# Patient Record
Sex: Female | Born: 1990 | Hispanic: Yes | Marital: Married | Smoking: Never smoker
Health system: Southern US, Community
[De-identification: ages and names within clinical notes are randomized; demographics above are authoritative.]

## PROBLEM LIST (undated history)

## (undated) DIAGNOSIS — Z789 Other specified health status: Secondary | ICD-10-CM

## (undated) HISTORY — PX: NO PAST SURGERIES: SHX2092

---

## 2017-10-31 NOTE — L&D Delivery Note (Signed)
Date of delivery: 09/08/2018 Estimated Date of Delivery: 09/16/18 Patient's last menstrual period was 12/18/2017 (approximate). EGA: [redacted]w[redacted]d  Delivery Note At 8:59 PM a viable female was delivered via Vaginal, Spontaneous (Presentation: OA;  LOA).  APGAR: 7, 9;  weight:  8 pounds 2.9 ounces, 3710 grams.   Placenta status: spontaneous, intact.   Cord:  with the following complications: none.  Cord pH: NA  Mom pushed well over 2 contractions to deliver a viable female infant.  The head followed by shoulders, which were snug, and the rest of the body delivered easily.  Nuchal cord reduced on the perineum.  Baby to mom's chest.  Cord clamped and cut after 3 min delay.  Cord blood obtained.  Placenta delivered spontaneously, intact, with a 3-vessel cord. Perineum intact and no other lacerations. All counts correct.  Hemostasis obtained with IV pitocin and fundal massage.     Anesthesia: IV analgesia given 1 hour before delivery  Episiotomy: None Lacerations: None Suture Repair: NA Est. Blood Loss (mL): 600  Mom to postpartum.  Baby to Couplet care / Skin to Skin.  Tresea Mall, CNM 09/08/2018, 9:33 PM

## 2018-03-30 ENCOUNTER — Ambulatory Visit (INDEPENDENT_AMBULATORY_CARE_PROVIDER_SITE_OTHER): Payer: Medicaid Other

## 2018-03-30 ENCOUNTER — Other Ambulatory Visit: Payer: Self-pay | Admitting: Obstetrics & Gynecology

## 2018-03-30 DIAGNOSIS — Z3491 Encounter for supervision of normal pregnancy, unspecified, first trimester: Secondary | ICD-10-CM

## 2018-04-25 ENCOUNTER — Other Ambulatory Visit: Payer: Self-pay | Admitting: Obstetrics & Gynecology

## 2018-04-25 DIAGNOSIS — Z363 Encounter for antenatal screening for malformations: Secondary | ICD-10-CM

## 2018-05-01 ENCOUNTER — Ambulatory Visit (INDEPENDENT_AMBULATORY_CARE_PROVIDER_SITE_OTHER): Payer: Medicaid Other

## 2018-05-01 DIAGNOSIS — Z363 Encounter for antenatal screening for malformations: Secondary | ICD-10-CM

## 2018-05-22 ENCOUNTER — Other Ambulatory Visit: Payer: Self-pay | Admitting: Obstetrics and Gynecology

## 2018-05-22 DIAGNOSIS — Z0489 Encounter for examination and observation for other specified reasons: Secondary | ICD-10-CM

## 2018-05-22 DIAGNOSIS — IMO0002 Reserved for concepts with insufficient information to code with codable children: Secondary | ICD-10-CM

## 2018-07-03 ENCOUNTER — Ambulatory Visit (INDEPENDENT_AMBULATORY_CARE_PROVIDER_SITE_OTHER): Payer: Medicaid Other

## 2018-07-03 DIAGNOSIS — Z0489 Encounter for examination and observation for other specified reasons: Secondary | ICD-10-CM

## 2018-07-03 DIAGNOSIS — Z362 Encounter for other antenatal screening follow-up: Secondary | ICD-10-CM

## 2018-07-03 DIAGNOSIS — IMO0002 Reserved for concepts with insufficient information to code with codable children: Secondary | ICD-10-CM

## 2018-07-09 ENCOUNTER — Encounter: Payer: Self-pay | Admitting: Family Medicine

## 2018-07-09 DIAGNOSIS — O4440 Low lying placenta NOS or without hemorrhage, unspecified trimester: Secondary | ICD-10-CM | POA: Insufficient documentation

## 2018-09-08 ENCOUNTER — Other Ambulatory Visit: Payer: Self-pay

## 2018-09-08 ENCOUNTER — Emergency Department: Admission: EM | Admit: 2018-09-08 | Payer: Self-pay | Source: Home / Self Care | Admitting: Obstetrics and Gynecology

## 2018-09-08 ENCOUNTER — Inpatient Hospital Stay
Admission: AD | Admit: 2018-09-08 | Discharge: 2018-09-10 | DRG: 807 | Disposition: A | Discharge status: Home / Self Care | Payer: Medicaid Other | Attending: Obstetrics and Gynecology | Admitting: Obstetrics and Gynecology

## 2018-09-08 ENCOUNTER — Encounter: Payer: Self-pay | Admitting: *Deleted

## 2018-09-08 DIAGNOSIS — O4202 Full-term premature rupture of membranes, onset of labor within 24 hours of rupture: Secondary | ICD-10-CM | POA: Diagnosis not present

## 2018-09-08 DIAGNOSIS — O4292 Full-term premature rupture of membranes, unspecified as to length of time between rupture and onset of labor: Secondary | ICD-10-CM | POA: Diagnosis present

## 2018-09-08 DIAGNOSIS — Z3A38 38 weeks gestation of pregnancy: Secondary | ICD-10-CM

## 2018-09-08 DIAGNOSIS — O4290 Premature rupture of membranes, unspecified as to length of time between rupture and onset of labor, unspecified weeks of gestation: Secondary | ICD-10-CM

## 2018-09-08 HISTORY — DX: Other specified health status: Z78.9

## 2018-09-08 LAB — CBC
HCT: 37.5 % (ref 36.0–46.0)
Hemoglobin: 12.2 g/dL (ref 12.0–15.0)
MCH: 30 pg (ref 26.0–34.0)
MCHC: 32.5 g/dL (ref 30.0–36.0)
MCV: 92.4 fL (ref 80.0–100.0)
PLATELETS: 189 10*3/uL (ref 150–400)
RBC: 4.06 MIL/uL (ref 3.87–5.11)
RDW: 13.4 % (ref 11.5–15.5)
WBC: 7.3 10*3/uL (ref 4.0–10.5)
nRBC: 0 % (ref 0.0–0.2)

## 2018-09-08 LAB — TYPE AND SCREEN
ABO/RH(D): O POS
Antibody Screen: NEGATIVE

## 2018-09-08 MED ORDER — ONDANSETRON HCL 4 MG/2ML IJ SOLN: 4.0000 mg | INTRAMUSCULAR | Status: DC | PRN

## 2018-09-08 MED ORDER — DIBUCAINE 1 % RE OINT: 1.0000 "application " | TOPICAL_OINTMENT | RECTAL | Status: DC | PRN

## 2018-09-08 MED ORDER — ACETAMINOPHEN 325 MG PO TABS: 650.0000 mg | ORAL_TABLET | ORAL | Status: DC | PRN

## 2018-09-08 MED ORDER — OXYTOCIN 10 UNIT/ML IJ SOLN
INTRAMUSCULAR | Status: AC
  Filled 2018-09-08: qty 2

## 2018-09-08 MED ORDER — LACTATED RINGERS IV SOLN: 500.0000 mL | INTRAVENOUS | Status: DC | PRN

## 2018-09-08 MED ORDER — ONDANSETRON HCL 4 MG PO TABS: 4.0000 mg | ORAL_TABLET | ORAL | Status: DC | PRN

## 2018-09-08 MED ORDER — LIDOCAINE HCL (PF) 1 % IJ SOLN: 30.0000 mL | INTRAMUSCULAR | Status: DC | PRN

## 2018-09-08 MED ORDER — SIMETHICONE 80 MG PO CHEW: 80.0000 mg | CHEWABLE_TABLET | ORAL | Status: DC | PRN

## 2018-09-08 MED ORDER — LIDOCAINE HCL (PF) 1 % IJ SOLN
INTRAMUSCULAR | Status: AC
  Filled 2018-09-08: qty 30

## 2018-09-08 MED ORDER — SENNOSIDES-DOCUSATE SODIUM 8.6-50 MG PO TABS
2.0000 | ORAL_TABLET | ORAL | Status: DC
  Administered 2018-09-09 – 2018-09-10 (×2): 2 via ORAL
  Filled 2018-09-08 (×2): qty 2

## 2018-09-08 MED ORDER — TERBUTALINE SULFATE 1 MG/ML IJ SOLN: 0.2500 mg | Freq: Once | INTRAMUSCULAR | Status: DC | PRN

## 2018-09-08 MED ORDER — MISOPROSTOL 200 MCG PO TABS
ORAL_TABLET | ORAL | Status: AC
  Filled 2018-09-08: qty 4

## 2018-09-08 MED ORDER — COCONUT OIL OIL: 1.0000 "application " | TOPICAL_OIL | Status: DC | PRN

## 2018-09-08 MED ORDER — AMMONIA AROMATIC IN INHA
RESPIRATORY_TRACT | Status: AC
  Filled 2018-09-08: qty 10

## 2018-09-08 MED ORDER — ONDANSETRON HCL 4 MG/2ML IJ SOLN: 4.0000 mg | Freq: Four times a day (QID) | INTRAMUSCULAR | Status: DC | PRN

## 2018-09-08 MED ORDER — BENZOCAINE-MENTHOL 20-0.5 % EX AERO: 1.0000 "application " | INHALATION_SPRAY | CUTANEOUS | Status: DC | PRN

## 2018-09-08 MED ORDER — OXYTOCIN BOLUS FROM INFUSION: 500.0000 mL | Freq: Once | INTRAVENOUS | Status: DC

## 2018-09-08 MED ORDER — DIPHENHYDRAMINE HCL 25 MG PO CAPS: 25.0000 mg | ORAL_CAPSULE | Freq: Four times a day (QID) | ORAL | Status: DC | PRN

## 2018-09-08 MED ORDER — BUTORPHANOL TARTRATE 1 MG/ML IJ SOLN
1.0000 mg | INTRAMUSCULAR | Status: DC | PRN
  Administered 2018-09-08: 1 mg via INTRAVENOUS
  Filled 2018-09-08: qty 1

## 2018-09-08 MED ORDER — WITCH HAZEL-GLYCERIN EX PADS: 1.0000 "application " | MEDICATED_PAD | CUTANEOUS | Status: DC | PRN

## 2018-09-08 MED ORDER — OXYTOCIN 40 UNITS IN LACTATED RINGERS INFUSION - SIMPLE MED
1.0000 m[IU]/min | INTRAVENOUS | Status: DC
  Administered 2018-09-08: 2 m[IU]/min via INTRAVENOUS
  Filled 2018-09-08: qty 1000

## 2018-09-08 MED ORDER — OXYTOCIN 40 UNITS IN LACTATED RINGERS INFUSION - SIMPLE MED: 2.5000 [IU]/h | INTRAVENOUS | Status: DC

## 2018-09-08 MED ORDER — OXYTOCIN 40 UNITS IN LACTATED RINGERS INFUSION - SIMPLE MED
INTRAVENOUS | Status: AC
  Filled 2018-09-08: qty 1000

## 2018-09-08 MED ORDER — IBUPROFEN 600 MG PO TABS
600.0000 mg | ORAL_TABLET | Freq: Four times a day (QID) | ORAL | Status: DC
  Administered 2018-09-09 – 2018-09-10 (×4): 600 mg via ORAL
  Filled 2018-09-08 (×6): qty 1

## 2018-09-08 MED ORDER — LACTATED RINGERS IV SOLN
INTRAVENOUS | Status: DC
  Administered 2018-09-08: 13:00:00 via INTRAVENOUS

## 2018-09-08 MED ORDER — PRENATAL MULTIVITAMIN CH
1.0000 | ORAL_TABLET | Freq: Every day | ORAL | Status: DC
  Administered 2018-09-09 – 2018-09-10 (×2): 1 via ORAL
  Filled 2018-09-08 (×2): qty 1

## 2018-09-08 NOTE — H&P (Signed)
OB History & Physical   History of Present Illness:  Chief Complaint: water broke  HPI:  Bridget Cline is a 27 y.o. G2P1001 female at [redacted]w[redacted]d dated by 15 week u/s.  Her pregnancy has been complicated by UTI/treated, marginal placenta previa- resolved per September u/s, varicella non-immune.    She denies contractions.   She reports leakage of fluid at 8 AM this morning.   She denies vaginal bleeding.   She reports fetal movement.    Maternal Medical History:   Past Medical History:  Diagnosis Date  . Eating disorders, special diets, PICA     Past Surgical History:  Procedure Laterality Date  . NO PAST SURGERIES       Prior to Admission medications   Medication Sig Start Date End Date Taking? Authorizing Provider  Prenatal Vit-Fe Fumarate-FA (MULTIVITAMIN-PRENATAL) 27-0.8 MG TABS tablet Take 1 tablet by mouth daily at 12 noon.   Yes [provider]    OB History  Gravida Para Term Preterm AB Living  2 1 1  0 0 1  SAB TAB Ectopic Multiple Live Births               # Outcome Date GA Lbr Len/2nd Weight Sex Delivery Anes PTL Lv  2 Current           1 Term             Prenatal care site: [facility]  Social History: She  reports that she has never smoked. She has never used smokeless tobacco. She reports that she does not drink alcohol or use drugs.  Family History: gestational diabetes  She does not have a family history of gynecologic cancers  Review of Systems: Negative x 10 systems reviewed except as noted in the HPI.    Physical Exam:  Vital Signs: Ht 5' (1.524 m)   Wt 80.7 kg   BMI 34.76 kg/m  Constitutional: Well nourished, well developed female in no acute distress.  HEENT: normal Skin: Warm and dry.  Cardiovascular: Regular rate and rhythm.   Extremity: 2+ reflexes, no edema  Respiratory: Clear to auscultation bilateral. Normal respiratory effort Abdomen: FHT present Back: no CVAT Neuro: DTRs 2+, Cranial nerves grossly  intact Psych: Alert and Oriented x3. No memory deficits. Normal mood and affect.  MS: normal gait, normal bilateral lower extremity ROM/strength/stability.  Pelvic exam:  is not limited by body habitus EGBUS: within normal limits Vagina: within normal limits and with normal mucosa  Cervix: 3 cm/50/-2 Sterile speculum exam is positive for Pooling clear/pink tinged- grossly ruptured, fetal hair seen, +nitrazine   Pertinent Results:  Prenatal Labs: Blood type/Rh O positive  Antibody screen negative  Rubella Immune  Varicella Not immune    RPR Non-reactive  HBsAg negative  HIV negative  GC negative  Chlamydia negative  Genetic screening declined  1 hour GTT 81  3 hour GTT NA  GBS negative on 10/23   Baseline FHR: 135 beats/min   Variability: moderate   Accelerations: present   Decelerations: absent Contractions: present frequency: 2-10 Overall assessment: reassuring   Assessment:  Bridget Cline is a 27 y.o. G51P1001 female at [redacted]w[redacted]d with SROM clear, irregular contractions not felt by patient.   Plan:  1. Admit to Labor & Delivery  2. CBC, T&S, Clrs, IVF 3. GBS negative.   4. Fetal well-being: Category I 5. Ambulate and re-check for cervical change, Pitocin as needed   Tresea Mall, CNM 09/08/2018 11:28 AM

## 2018-09-08 NOTE — Discharge Summary (Signed)
OB Discharge Summary     Patient Name: Bridget Cline DOB: [birth year] MRN: [id number]  Date of admission: 09/08/2018 Delivering MD: Tresea Mall, CNM  Date of Delivery: 09/08/2018  Date of discharge: 09/10/2018  Admitting diagnosis: Leaking fluid, spontaneous rupture of membranes Intrauterine pregnancy: [redacted]w[redacted]d     Secondary diagnosis: None     Discharge diagnosis: Term Pregnancy Delivered                                                                                                Post partum procedures: none  Augmentation: Pitocin  Complications: None  Hospital course:  Induction of Labor With Vaginal Delivery   27 y.o. yo G2P1001 at [redacted]w[redacted]d was admitted to the hospital 09/08/2018 for induction of labor.   Indication for induction: PROM.  Patient had an uncomplicated labor course as follows: Membrane Rupture Time/Date: 8:00 AM ,09/08/2018   Patient had delivery of viable female 8:59 PM, 09/08/2018  Details of delivery can be found in separate delivery note.   Patient had a routine postpartum course.  Patient is discharged home on 09/10/2018.  Physical exam  Vitals:   09/09/18 1648 09/09/18 1923 09/10/18 0015 09/10/18 0737  BP: 97/65 112/71 103/62 113/85  Pulse: 84 99 77 77  Resp: 18 20 20 20   Temp: 98.7 F (37.1 C) 98.1 F (36.7 C) 97.7 F (36.5 C) 98.2 F (36.8 C)  TempSrc: Oral Oral Oral Oral  SpO2: 97% 98% 99% 98%  Weight:      Height:       General: alert, cooperative and no distress Lochia: appropriate Uterine Fundus: firm Incision: N/A DVT Evaluation: No evidence of DVT seen on physical exam.  Labs: Lab Results  Component Value Date   WBC 17.9 (H) 09/09/2018   HGB 10.6 (L) 09/09/2018   HCT 31.9 (L) 09/09/2018   MCV 91.1 09/09/2018   PLT 175 09/09/2018    Discharge instruction: per After Visit Summary.  Medications:  Allergies as of 09/10/2018   Not on File     Medication List    TAKE these medications   multivitamin-prenatal 27-0.8 MG  Tabs tablet Take 1 tablet by mouth daily at 12 noon.       Diet: routine diet  Activity: Advance as tolerated. Pelvic rest for 6 weeks.   Outpatient follow up: Follow-up Information    Department, [facility]. Schedule an appointment as soon as possible for a visit in 6 week(s).   Why:  postpartum follow up Contact information: [address] [place] [address] [phone]             Postpartum contraception: Natural Family Planning Rhogam Given postpartum: NA Rubella vaccine given postpartum: Rubella Immune Varicella vaccine given postpartum: ordered TDaP given antepartum or postpartum: given antepartum  Newborn Data: Live born female Margaretmary Lombard Birth Weight: 3710 grams, 8 pounds 2.9 ounces  APGAR: 7, 9  Newborn Delivery   Birth date/time:  09/08/2018 20:59:00 Delivery type:  Vaginal, Spontaneous      Baby Feeding: Breast and Formula  Disposition: Newborn receiving phototherapy, may be able to discharge  with mother later today. Otherwise will plan to board mother overnight.  SIGNED:  Oswaldo Conroy, CNM 09/10/2018 10:41 AM

## 2018-09-08 NOTE — Progress Notes (Signed)
  Labor Progress Note   27 y.o. G2P1001 @ [redacted]w[redacted]d , admitted for  Pregnancy, Labor Management. Augmentation for SROM  Subjective:  Patient is requesting pain medication. Interpreter on a stick in the room. Discussion of the options.   Objective:  BP 113/61   Pulse 83   Temp 98.2 F (36.8 C) (Oral)   Resp 16   Ht 5' (1.524 m)   Wt 80.7 kg   LMP 12/18/2017 (Approximate)   SpO2 95%   BMI 34.76 kg/m  Abd: mild Extr: no edema SVE: CERVIX: 5 cm dilated, 90 effaced, 0 station  EFM: FHR: 140 bpm, variability: moderate,  accelerations:  Present,  decelerations:  Present early decelerations noted Toco: Frequency: Every 2-3 minutes Labs: I have reviewed the patient's lab results.   Assessment & Plan:  G2P1001 @ [redacted]w[redacted]d, admitted for  Pregnancy and Labor/Delivery Management  1. Pain management: 1 mg stadol. 2. FWB: FHT category II for early decelerations and overall reassuring.  3. ID: GBS negative 4. Labor management: continue pitocin  All discussed with patient, see orders  Tresea Mall, CNM [facility], [facility] 09/08/2018  7:58 PM

## 2018-09-08 NOTE — OB Triage Note (Signed)
SROM @ 0800 this am. Reports good fetal movement. Elaina Hoops

## 2018-09-08 NOTE — Progress Notes (Signed)
  Labor Progress Note   27 y.o. G2P1001 @ [redacted]w[redacted]d , admitted for  Pregnancy, Labor Management. SROM this morning  Subjective:  Patient is still comfortable and not feeling contractions. Translator is in the room. Discussion of augmentation with pitocin. All questions answered.   Objective:  BP 106/62 (BP Location: Left Arm)   Pulse 84   Temp 98.5 F (36.9 C) (Oral)   Resp 18   Ht 5' (1.524 m)   Wt 80.7 kg   LMP 12/18/2017 (Approximate)   BMI 34.76 kg/m  Abd: mild Extr: no edema SVE: deferred  EFM: FHR: 130 bpm, variability: moderate,  accelerations:  Present,  decelerations:  Absent Toco: Frequency: Every 1-8 minutes Labs: I have reviewed the patient's lab results.   Assessment & Plan:  G2P1001 @ [redacted]w[redacted]d, admitted for  Pregnancy and Labor/Delivery Management  1. Pain management: none. 2. FWB: FHT category I.  3. ID: GBS negative 4. Labor management: start pitocin  All discussed with patient, see orders   Tresea Mall, CNM [facility], [facility] 09/08/2018  2:07 PM

## 2018-09-08 NOTE — Progress Notes (Signed)
  Labor Progress Note   27 y.o. G2P1001 @ [redacted]w[redacted]d , admitted for  Pregnancy, Labor Management. Augmentation for SROM  Subjective:  She has been feeling the contractions and is coping well through them. She has been walking.  Objective:  BP 113/61   Pulse 83   Temp 98.2 F (36.8 C) (Oral)   Resp 16   Ht 5' (1.524 m)   Wt 80.7 kg   LMP 12/18/2017 (Approximate)   SpO2 95%   BMI 34.76 kg/m  Abd: mild Extr: no edema SVE: CERVIX: 4 cm dilated, 80 effaced, -1 station  EFM: FHR: 135 bpm, variability: moderate,  accelerations:  Present,  decelerations:  Absent Toco: Frequency: Every 2-3 minutes Labs: I have reviewed the patient's lab results.   Assessment & Plan:  G2P1001 @ [redacted]w[redacted]d, admitted for  Pregnancy and Labor/Delivery Management  1. Pain management: none. 2. FWB: FHT category I.  3. ID: GBS negative 4. Labor management: Continue pitocin  All discussed with patient, see orders   Tresea Mall, CNM [facility], [facility] 09/08/2018  6:59 PM

## 2018-09-09 DIAGNOSIS — Z3A38 38 weeks gestation of pregnancy: Secondary | ICD-10-CM

## 2018-09-09 DIAGNOSIS — O4202 Full-term premature rupture of membranes, onset of labor within 24 hours of rupture: Secondary | ICD-10-CM

## 2018-09-09 LAB — CBC
HCT: 31.9 % — ABNORMAL LOW (ref 36.0–46.0)
Hemoglobin: 10.6 g/dL — ABNORMAL LOW (ref 12.0–15.0)
MCH: 30.3 pg (ref 26.0–34.0)
MCHC: 33.2 g/dL (ref 30.0–36.0)
MCV: 91.1 fL (ref 80.0–100.0)
NRBC: 0 % (ref 0.0–0.2)
PLATELETS: 175 10*3/uL (ref 150–400)
RBC: 3.5 MIL/uL — ABNORMAL LOW (ref 3.87–5.11)
RDW: 13.5 % (ref 11.5–15.5)
WBC: 17.9 10*3/uL — AB (ref 4.0–10.5)

## 2018-09-09 NOTE — Progress Notes (Signed)
Post Partum Day 1 Subjective: Doing well, no complaints.  Tolerating regular diet, pain with PO meds, voiding and ambulating without difficulty. Baby has latched at breast and she is pumping, but is concerned that she has no milk yet, so she is also supplementing with formula. Encouraged continued stimulation of breast.   No CP SOB F/C N/V or leg pain No HA, change of vision, RUQ/epigastric pain  Objective: BP (!) 97/53 (BP Location: Right Arm)   Pulse 92   Temp 98.7 F (37.1 C) (Oral)   Resp 18   Ht 5' (1.524 m)   Wt 80.7 kg   LMP 12/18/2017 (Approximate)   SpO2 99%   BMI 34.76 kg/m    Physical Exam:  General: NAD CV: RRR Pulm: nl effort, CTABL Lochia: moderate Uterine Fundus: fundus firm and below umbilicus DVT Evaluation: no cords, ttp LEs   Recent Labs    09/08/18 1327 09/09/18 0520  HGB 12.2 10.6*  HCT 37.5 31.9*  WBC 7.3 17.9*  PLT 189 175    Assessment/Plan: 27 y.o. G2P1001 postpartum day # 1  1. Continue routine postpartum care  2. O positive, Rubella Immune, Varicella Non-immune 3. TDAP given antepartum 4. Breast and formula feeding/Contraception: natural family planning    Tresea Mall, CNM [facility], [facility]

## 2018-09-09 NOTE — Lactation Note (Signed)
This note was copied from a baby's chart. Lactation Consultation Note  Patient Name: Girl Alicianna Litchford Today's Date: 09/09/2018     Maternal Data    Feeding    LATCH Score                   Interventions    Lactation Tools Discussed/Used     Consult Status  Parents state that they think breastfeeding is going well, but are unsure if baby is getting enough at the breast so have been following up with formula. LC used translator to explain physiology of breastfeeding and spoke with parents about frequent removal of milk creating more milk. LC also explained newborn stomach size and transitional stool as well as breastfeeding resources at discharge.     Burnadette Peter 09/09/2018, 4:15 PM

## 2018-09-10 DIAGNOSIS — O4202 Full-term premature rupture of membranes, onset of labor within 24 hours of rupture: Secondary | ICD-10-CM

## 2018-09-10 DIAGNOSIS — Z3A38 38 weeks gestation of pregnancy: Secondary | ICD-10-CM

## 2018-09-10 LAB — RPR: RPR: NONREACTIVE

## 2018-09-10 MED ORDER — VARICELLA VIRUS VACCINE LIVE 1350 PFU/0.5ML IJ SUSR
0.5000 mL | Freq: Once | INTRAMUSCULAR | Status: AC
  Administered 2018-09-10: 0.5 mL via SUBCUTANEOUS
  Filled 2018-09-10 (×2): qty 0.5

## 2018-09-10 NOTE — Progress Notes (Signed)
pt discharged home with infant.  Discharge instructions, prescriptions and follow up appointment given to and reviewed with pt.  Pt verbalized understanding, all questions answered.  Escorted by auxiliary. 

## 2018-09-10 NOTE — Lactation Note (Signed)
This note was copied from a baby's chart. Lactation Consultation Note  Patient Name: Bridget Cline GNFAO'Z Date: 09/10/2018 Reason for consult: Follow-up assessment;Hyperbilirubinemia(Jacque spanish interpreter present)   Maternal Data Formula Feeding for Exclusion: No Does the patient have breastfeeding experience prior to this delivery?: Yes  Feeding Feeding Type: (did not observe a feeding) Nipple Type: Slow - flow  LATCH Score Latch: (baby sleeping under bili lites, no feeding observed)                 Interventions Interventions: Breast feeding basics reviewed(encouraged feeding at breast first then supplementing after)  Lactation Tools Discussed/Used WIC Program: Yes Pump Review: Setup, frequency, and cleaning(use of manual pump) Initiated by:: Cay Schillings [facility] IBCLC   Consult Status Consult Status: PRN    Dyann Kief 09/10/2018, 4:04 PM

## 2018-09-29 ENCOUNTER — Emergency Department: Payer: Medicaid Other

## 2018-09-29 ENCOUNTER — Other Ambulatory Visit: Payer: Self-pay

## 2018-09-29 ENCOUNTER — Encounter: Payer: Self-pay | Admitting: Emergency Medicine

## 2018-09-29 ENCOUNTER — Inpatient Hospital Stay
Admission: EM | Admit: 2018-09-29 | Discharge: 2018-10-01 | DRG: 776 | Disposition: A | Discharge status: Home / Self Care | Payer: Medicaid Other | Attending: Internal Medicine | Admitting: Internal Medicine

## 2018-09-29 DIAGNOSIS — O9122 Nonpurulent mastitis associated with the puerperium: Principal | ICD-10-CM | POA: Diagnosis present

## 2018-09-29 DIAGNOSIS — A419 Sepsis, unspecified organism: Secondary | ICD-10-CM | POA: Diagnosis present

## 2018-09-29 DIAGNOSIS — N61 Mastitis without abscess: Secondary | ICD-10-CM

## 2018-09-29 DIAGNOSIS — O9229 Other disorders of breast associated with pregnancy and the puerperium: Secondary | ICD-10-CM | POA: Diagnosis present

## 2018-09-29 LAB — CG4 I-STAT (LACTIC ACID)
Lactic Acid, Venous: 2.04 mmol/L (ref 0.5–1.9)
Lactic Acid, Venous: 1.22 mmol/L (ref 0.5–1.9)

## 2018-09-29 LAB — CBC WITH DIFFERENTIAL/PLATELET
Abs Immature Granulocytes: 0.25 10*3/uL — ABNORMAL HIGH (ref 0.00–0.07)
BASOS ABS: 0.1 10*3/uL (ref 0.0–0.1)
Basophils Relative: 0 %
Eosinophils Absolute: 0.2 10*3/uL (ref 0.0–0.5)
Eosinophils Relative: 1 %
HCT: 40.2 % (ref 36.0–46.0)
Hemoglobin: 13.9 g/dL (ref 12.0–15.0)
Immature Granulocytes: 2 %
Lymphocytes Relative: 9 %
Lymphs Abs: 1.4 10*3/uL (ref 0.7–4.0)
MCH: 29.8 pg (ref 26.0–34.0)
MCHC: 34.6 g/dL (ref 30.0–36.0)
MCV: 86.1 fL (ref 80.0–100.0)
Monocytes Absolute: 0.6 10*3/uL (ref 0.1–1.0)
Monocytes Relative: 4 %
NEUTROS ABS: 13.2 10*3/uL — AB (ref 1.7–7.7)
NRBC: 0 % (ref 0.0–0.2)
Neutrophils Relative %: 84 %
Platelets: 316 10*3/uL (ref 150–400)
RBC: 4.67 MIL/uL (ref 3.87–5.11)
RDW: 13.1 % (ref 11.5–15.5)
WBC: 15.6 10*3/uL — AB (ref 4.0–10.5)

## 2018-09-29 LAB — COMPREHENSIVE METABOLIC PANEL
ALT: 23 U/L (ref 0–44)
AST: 26 U/L (ref 15–41)
Albumin: 3.5 g/dL (ref 3.5–5.0)
Alkaline Phosphatase: 68 U/L (ref 38–126)
Anion gap: 13 (ref 5–15)
BUN: 12 mg/dL (ref 6–20)
CHLORIDE: 96 mmol/L — AB (ref 98–111)
CO2: 24 mmol/L (ref 22–32)
Calcium: 8.8 mg/dL — ABNORMAL LOW (ref 8.9–10.3)
Creatinine, Ser: 0.74 mg/dL (ref 0.44–1.00)
GFR calc Af Amer: >60 mL/min (ref >60)
Glucose, Bld: 140 mg/dL — ABNORMAL HIGH (ref 70–99)
Potassium: 3.6 mmol/L (ref 3.5–5.1)
SODIUM: 133 mmol/L — AB (ref 135–145)
Total Bilirubin: 1 mg/dL (ref 0.3–1.2)
Total Protein: 7.9 g/dL (ref 6.5–8.1)

## 2018-09-29 MED ORDER — PRENATAL MULTIVITAMIN CH
1.0000 | ORAL_TABLET | Freq: Every day | ORAL | Status: DC
  Administered 2018-10-01: 1 via ORAL
  Filled 2018-09-29 (×2): qty 1

## 2018-09-29 MED ORDER — ONDANSETRON HCL 4 MG PO TABS: 4.0000 mg | ORAL_TABLET | Freq: Four times a day (QID) | ORAL | Status: DC | PRN

## 2018-09-29 MED ORDER — PIPERACILLIN-TAZOBACTAM 3.375 G IVPB
3.3750 g | Freq: Three times a day (TID) | INTRAVENOUS | Status: DC
  Administered 2018-09-29 – 2018-10-01 (×5): 3.375 g via INTRAVENOUS
  Filled 2018-09-29 (×7): qty 50

## 2018-09-29 MED ORDER — ACETAMINOPHEN 650 MG RE SUPP: 650.0000 mg | Freq: Four times a day (QID) | RECTAL | Status: DC | PRN

## 2018-09-29 MED ORDER — ENOXAPARIN SODIUM 40 MG/0.4ML ~~LOC~~ SOLN
40.0000 mg | SUBCUTANEOUS | Status: DC
  Administered 2018-09-29 – 2018-10-01 (×2): 40 mg via SUBCUTANEOUS
  Filled 2018-09-29 (×2): qty 0.4

## 2018-09-29 MED ORDER — SODIUM CHLORIDE 0.9 % IV BOLUS
1000.0000 mL | Freq: Once | INTRAVENOUS | Status: AC
  Administered 2018-09-29: 1000 mL via INTRAVENOUS

## 2018-09-29 MED ORDER — ACETAMINOPHEN 325 MG PO TABS
650.0000 mg | ORAL_TABLET | Freq: Four times a day (QID) | ORAL | Status: DC | PRN
  Administered 2018-09-29 – 2018-10-01 (×4): 650 mg via ORAL
  Filled 2018-09-29 (×4): qty 2

## 2018-09-29 MED ORDER — ACETAMINOPHEN 500 MG PO TABS
1000.0000 mg | ORAL_TABLET | Freq: Once | ORAL | Status: AC
  Administered 2018-09-29: 1000 mg via ORAL
  Filled 2018-09-29: qty 2

## 2018-09-29 MED ORDER — ONDANSETRON HCL 4 MG/2ML IJ SOLN: 4.0000 mg | Freq: Four times a day (QID) | INTRAMUSCULAR | Status: DC | PRN

## 2018-09-29 MED ORDER — VANCOMYCIN HCL IN DEXTROSE 1-5 GM/200ML-% IV SOLN
1000.0000 mg | Freq: Once | INTRAVENOUS | Status: AC
  Administered 2018-09-29: 1000 mg via INTRAVENOUS
  Filled 2018-09-29: qty 200

## 2018-09-29 MED ORDER — OXYCODONE HCL 5 MG PO TABS: 5.0000 mg | ORAL_TABLET | Freq: Four times a day (QID) | ORAL | Status: DC | PRN

## 2018-09-29 MED ORDER — SODIUM CHLORIDE 0.9 % IV SOLN
INTRAVENOUS | Status: DC
  Administered 2018-09-29 – 2018-10-01 (×4): via INTRAVENOUS

## 2018-09-29 MED ORDER — VANCOMYCIN HCL IN DEXTROSE 750-5 MG/150ML-% IV SOLN
750.0000 mg | Freq: Two times a day (BID) | INTRAVENOUS | Status: DC
  Administered 2018-09-30 – 2018-10-01 (×3): 750 mg via INTRAVENOUS
  Filled 2018-09-29 (×6): qty 150

## 2018-09-29 MED ORDER — POLYETHYLENE GLYCOL 3350 17 G PO PACK
17.0000 g | PACK | Freq: Every day | ORAL | Status: DC | PRN
  Filled 2018-09-29: qty 1

## 2018-09-29 NOTE — ED Notes (Signed)
Patient transported to Ultrasound 

## 2018-09-29 NOTE — H&P (Addendum)
[facility] [place] [facility]   PATIENT NAME: Bridget Cline    MR#:  [id number]  DATE OF BIRTH:  12-Jan-1991  DATE OF ADMISSION:  09/29/2018  PRIMARY CARE PHYSICIAN: Patient, No Pcp Per   REQUESTING/REFERRING PHYSICIAN: Minna Antis, MD  CHIEF COMPLAINT:   Chief Complaint  Patient presents with  . Breast Pain    HISTORY OF PRESENT ILLNESS:  Bridget Cline  is a 27 y.o. female with a known history of recent vaginal delivery on 09/08/2018 who presented to the ED with left breast pain that started this morning.  She also endorses redness and warmth of the left breast, in addition to fevers and chills.  She had been recently diagnosed with mastitis and was prescribed dicloxacillin.  She took 4 days of the antibiotic and then stopped because she felt that her baby seemed more fussy.  She stopped taking the antibiotic on Monday.  Her pain, redness, and warmth returned this morning.  She is both breast and bottlefeeding.  She is breast-feeding every 3 hours.  She has not noticed any bloody or purulent nipple discharge.  Her breast pain is worsened with breast-feeding.  In the ED, she was meeting sepsis criteria with a temperature of 102.9 and initial heart rate of 150.  Labs are significant for WBC 15.6 and lactic acid 2.04.  He was given 2 L normal saline bolus and was started on vancomycin.  Hospitalists were called for admission.  PAST MEDICAL HISTORY:   Past Medical History:  Diagnosis Date  . Medical history non-contributory     PAST SURGICAL HISTORY:   Past Surgical History:  Procedure Laterality Date  . NO PAST SURGERIES      SOCIAL HISTORY:   Social History   Tobacco Use  . Smoking status: Never Smoker  . Smokeless tobacco: Never Used  Substance Use Topics  . Alcohol use: Never    Frequency: Never    FAMILY HISTORY:  No family history on file.  DRUG ALLERGIES:  Not on File  REVIEW OF SYSTEMS:   Review of Systems    Constitutional: Positive for chills and fever.  HENT: Negative for congestion and sore throat.   Eyes: Negative for blurred vision and double vision.  Respiratory: Negative for cough and shortness of breath.   Cardiovascular: Negative for chest pain and leg swelling.  Gastrointestinal: Negative for abdominal pain, nausea and vomiting.  Genitourinary: Negative for dysuria, frequency and urgency.  Musculoskeletal: Negative for back pain and myalgias.  Neurological: Negative for dizziness and headaches.  Psychiatric/Behavioral: Negative for depression. The patient is not nervous/anxious.     MEDICATIONS AT HOME:   Prior to Admission medications   Medication Sig Start Date End Date Taking? Authorizing Provider  dicloxacillin (DYNAPEN) 500 MG capsule Take 500 mg by mouth 4 (four) times daily. 09/24/18 10/04/18 Yes [provider]  Prenatal Vit-Fe Fumarate-FA (PRENATAL VITAMIN PLUS LOW IRON) 27-1 MG TABS Take 1 tablet by mouth daily. 09/06/18  Yes [provider]      VITAL SIGNS:  Blood pressure (!) 92/51, pulse 95, temperature 100.1 F (37.8 C), temperature source Oral, resp. rate (!) 22, height 5' (1.524 m), weight 64.9 kg, SpO2 96 %, unknown if currently breastfeeding.  PHYSICAL EXAMINATION:  Physical Exam  GENERAL:  27 y.o.-year-old patient lying in the bed with no acute distress.  EYES: Pupils equal, round, reactive to light and accommodation. No scleral icterus. Extraocular muscles intact.  HEENT: Head atraumatic, normocephalic. Oropharynx and  nasopharynx clear.  NECK:  Supple, no jugular venous distention. No thyroid enlargement, no tenderness.  BREAST: left breast with erythema, warmth, and significant tenderness to palpation. No nipple discharge. LUNGS: Normal breath sounds bilaterally, no wheezing, rales,rhonchi or crepitation. No use of accessory muscles of respiration.  CARDIOVASCULAR: Tachycardic, regular rhythm, S1, S2 normal. No murmurs, rubs, or gallops.   ABDOMEN: Soft, nontender, nondistended. Bowel sounds present. No organomegaly or mass.  EXTREMITIES: No pedal edema, cyanosis, or clubbing.  NEUROLOGIC: Cranial nerves II through XII are intact. Muscle strength 5/5 in all extremities. Sensation intact. Gait not checked.  PSYCHIATRIC: The patient is alert and oriented x 3.  SKIN: No obvious rash, lesion, or ulcer.   LABORATORY PANEL:   CBC Recent Labs  Lab 09/29/18 1408  WBC 15.6*  HGB 13.9  HCT 40.2  PLT 316   ------------------------------------------------------------------------------------------------------------------  Chemistries  Recent Labs  Lab 09/29/18 1408  NA 133*  K 3.6  CL 96*  CO2 24  GLUCOSE 140*  BUN 12  CREATININE 0.74  CALCIUM 8.8*  AST 26  ALT 23  ALKPHOS 68  BILITOT 1.0   ------------------------------------------------------------------------------------------------------------------  Cardiac Enzymes No results for input(s): TROPONINI in the last 168 hours. ------------------------------------------------------------------------------------------------------------------  RADIOLOGY:  [place] Breast Ltd Uni Left Inc Axilla  Result Date: 09/29/2018 CLINICAL DATA:  Pain and redness of the left breast for 24 hours. Breastfeeding patient. EXAM: ULTRASOUND OF THE LEFT BREAST COMPARISON:  None. FINDINGS: Static images from targeted left breast ultrasound demonstrate breast edema with trabecular and skin thickening. No drainable fluid collections are seen. IMPRESSION: Left breast edema without drainable fluid collections. Findings are most consistent with acute mastitis. RECOMMENDATION: Clinical follow-up. I have discussed the findings and recommendations with the patient. Results were also provided in writing at the conclusion of the visit. If applicable, a reminder letter will be sent to the patient regarding the next appointment. BI-RADS CATEGORY  2: Benign. Electronically Signed   By: Ted Mcalpineobrinka  Dimitrova  M.D.   On: 09/29/2018 15:56      IMPRESSION AND PLAN:   Sepsis secondary to left-sided mastitis- previously on dicloxacillin for 4 days and she had improvement with this before stopping it, so did not fail outpatient therapy.  -s/p 2L NS bolus, will continue IVFs -Continue vancomycin and zosyn for now per pharmacy recommendations. Can likely narrow tomorrow. -Blood and urine cultures pending -Oxycodone and tylenol for pain  Lactic acidosis- likely due to above -IVFs -Trend lactic acid  Hypovolemic hyponatremia- Na 133 -IVFs -Repeat bmp in the morning  Recent vaginal delivery 09/08/18 -Continue prenatal vitamin -Breastfeeding ad lib  DVT prophylaxis- lovenox  All the records are reviewed and case discussed with ED provider. Management plans discussed with the patient, family and they are in agreement.  CODE STATUS: Full  TOTAL TIME TAKING CARE OF THIS PATIENT: 45 minutes.    Jinny BlossomKaty D Merlinda Wrubel M.D on 09/29/2018 at 6:00 PM  Between 7am to 6pm - Pager [phone]- [phone]  After 6pm go to [email or web address] - Social research officer, governmentpassword EPAS [facility]  [facility] [place] [facility]  Office  [phone]  CC: Primary care physician; Patient, No Pcp Per   Note: This dictation was prepared with Dragon dictation along with smaller phrase technology. Any transcriptional errors that result from this process are unintentional.

## 2018-09-29 NOTE — ED Notes (Signed)
Pt currently nursing infant on right breast. Pt asked if she was able to nurse with left side. Pt states no, due to pain.

## 2018-09-29 NOTE — ED Notes (Signed)
Interpretor used to reiterate the need to continue nursing on the left side as well as the right to continue milk production.

## 2018-09-29 NOTE — ED Provider Notes (Signed)
[facility] Emergency Department Provider Note  Time seen: 2:27 PM  I have reviewed the triage vital signs and the nursing notes. Spanish interpreter used for this evaluation  HISTORY  Chief Complaint Breast Pain    HPI Bridget Cline is a 27 y.o. female with a past medical history of mastitis, currently breast-feeding who presents to the emergency department for fever and left breast pain.  According to the patient approximately 2 weeks ago she developed left breast pain was diagnosed with mastitis and prescribed dicloxacillin.  Patient states she took the medication for approximately 3 days and then discontinued due to nausea.  Patient states over the past several days the left breast pain has come back and has worsened.  Has developed redness around the left breast with significant tenderness and now today febrile to 102 so she came to the emergency department for evaluation.   Past Medical History:  Diagnosis Date  . Medical history non-contributory     Patient Active Problem List   Diagnosis Date Noted  . Labor and delivery, indication for care 09/08/2018  . Postpartum care following vaginal delivery 09/08/2018  . Low-lying placenta 07/09/2018    Past Surgical History:  Procedure Laterality Date  . NO PAST SURGERIES      Prior to Admission medications   Medication Sig Start Date End Date Taking? Authorizing Provider  Prenatal Vit-Fe Fumarate-FA (MULTIVITAMIN-PRENATAL) 27-0.8 MG TABS tablet Take 1 tablet by mouth daily at 12 noon.    [provider]    Not on File  No family history on file.  Social History Social History   Tobacco Use  . Smoking status: Never Smoker  . Smokeless tobacco: Never Used  Substance Use Topics  . Alcohol use: Never    Frequency: Never  . Drug use: Never    Review of Systems Constitutional: Negative for fever. Eyes: Negative for visual complaints ENT: Negative for recent  illness/congestion Cardiovascular: Negative for chest pain. Respiratory: Negative for shortness of breath. Gastrointestinal: Negative for abdominal pain, vomiting and diarrhea. Genitourinary: Negative for urinary compaints Musculoskeletal: Negative for musculoskeletal complaints Skin: Negative for skin complaints  Neurological: Negative for headache All other ROS negative  ____________________________________________   PHYSICAL EXAM:  VITAL SIGNS: ED Triage Vitals  Enc Vitals Group     BP 09/29/18 1345 (!) 89/51     Pulse Rate 09/29/18 1345 (!) 150     Resp 09/29/18 1345 20     Temp 09/29/18 1345 (!) 102.9 F (39.4 C)     Temp Source 09/29/18 1345 Oral     SpO2 09/29/18 1345 96 %     Weight 09/29/18 1348 143 lb (64.9 kg)     Height 09/29/18 1348 5' (1.524 m)     Head Circumference --      Peak Flow --      Pain Score 09/29/18 1348 9     Pain Loc --      Pain Edu? --      Excl. in GC? --     Constitutional: Alert and oriented.  No acute distress. Eyes: Normal exam ENT   Head: Normocephalic and atraumatic.   Mouth/Throat: Mucous membranes are moist. Cardiovascular: Regular rhythm rate around 150 bpm.  No murmur. Respiratory: Normal respiratory effort without tachypnea nor retractions. Breath sounds are clear and equal bilaterally. No wheezes/rales/rhonchi. Gastrointestinal: Soft and nontender. No distention.  Musculoskeletal: Nontender with normal range of motion in all extremities. Neurologic:  Normal speech and language. No gross  focal neurologic deficits  Skin:  Skin is warm.  Patient has significant erythema of the left breast, no obvious discharge, moderate tenderness of the left breast. Psychiatric: Mood and affect are normal.   ____________________________________________   RADIOLOGY  Breast ultrasound pending  ____________________________________________   INITIAL IMPRESSION / ASSESSMENT AND PLAN / ED COURSE  Pertinent labs & imaging results  that were available during my care of the patient were reviewed by me and considered in my medical decision making (see chart for details).  Patient presents to the emergency department for left breast pain tenderness and fever at home.  Upon arrival to the emergency department patient found to be febrile to 102.9 tachycardic to 150 with a blood pressure of 89/51 consistent with sepsis.  I have ordered sepsis protocols including IV vancomycin for treatment of presumed mastitis.  We will obtain an ultrasound to rule out breast abscess.  We will continue with IV hydration.  Overall the patient appears well, no significant distress.  Breast ultrasound is pending.  Given the patient's leukocytosis of 15,000 tachycardia 150 with a temperature 102.9 meeting sepsis criteria we will start the patient on IV vancomycin to cover for MRSA.  Patient will be admitted to the hospitalist service, ultrasound pending.  Patient care signed out to oncoming physician.  I have discussed the patient with the hospitalist for admission pending the ultrasound results.  CRITICAL CARE Performed by: Minna AntisKevin Mallie Linnemann   Total critical care time: 30 minutes  Critical care time was exclusive of separately billable procedures and treating other patients.  Critical care was necessary to treat or prevent imminent or life-threatening deterioration.  Critical care was time spent personally by me on the following activities: development of treatment plan with patient and/or surrogate as well as nursing, discussions with consultants, evaluation of patient's response to treatment, examination of patient, obtaining history from patient or surrogate, ordering and performing treatments and interventions, ordering and review of laboratory studies, ordering and review of radiographic studies, pulse oximetry and re-evaluation of patient's condition.  ____________________________________________   FINAL CLINICAL IMPRESSION(S) / ED  DIAGNOSES  Mastitis Sepsis    Minna AntisPaduchowski, Kathlen Sakurai, MD 09/29/18 [phone]

## 2018-09-29 NOTE — Progress Notes (Signed)
CODE SEPSIS - PHARMACY COMMUNICATION  **Broad Spectrum Antibiotics should be administered within 1 hour of Sepsis diagnosis**  Time Code Sepsis Called/Page Received: 14:27  Antibiotics Ordered: vancomycin  Time of 1st antibiotic administration: 14:49  Additional action taken by pharmacy:   If necessary, Name of Provider/Nurse Contacted:     Carola FrostNathan A Henriette Hesser ,PharmD, BCPS Clinical Pharmacist  09/29/2018  2:27 PM

## 2018-09-29 NOTE — Plan of Care (Signed)

## 2018-09-29 NOTE — Progress Notes (Addendum)
Pharmacy Antibiotic Note  Bridget Cline is a 27 y.o. female admitted on 09/29/2018 with sepsis.  Pharmacy has been consulted for Vancomycin, Zosyn dosing.  Plan:  Zosyn 3.375 gm IV Q8H EI ordered to start on 11/30 @ 1800.   Vancomycin 1 gm IV X 1 given on 11/30 @ 1500. Vancomycin 750 mg IV Q12H ordered to start on 12/1 @ 21:00, ~ 6 hrs after 1st dose (stacked dosing). This pt will reach Css by 12/2 @ 1500. Will draw 1st trough on 12/2 @ 0830, which will be very close to Css.   CrCl = 88.8 ml/min ke = 0.078 hr-1 T1/2 = 8.9 hrs Vd = 37.3 L   Height: 5' (152.4 cm) Weight: 143 lb (64.9 kg) IBW/kg (Calculated) : 45.5  Temp (24hrs), Avg:101.5 F (38.6 C), Min:100.1 F (37.8 C), Max:102.9 F (39.4 C)  Recent Labs  Lab 09/29/18 1408 09/29/18 1414 09/29/18 1858  WBC 15.6*  --   --   CREATININE 0.74  --   --   LATICACIDVEN  --  2.04* 1.22    Estimated Creatinine Clearance: 88.9 mL/min (by C-G formula based on SCr of 0.74 mg/dL).    Not on File  Antimicrobials this admission:   >>    >>   Dose adjustments this admission:   Microbiology results:  BCx:   UCx:    Sputum:    MRSA PCR:   Thank you for allowing pharmacy to be a part of this patient's care.  Ilian Wessell D 09/29/2018 7:56 PM

## 2018-09-29 NOTE — ED Triage Notes (Signed)
Breast swelling and pain since last night, breastfeeding 28 day old infant.

## 2018-09-30 ENCOUNTER — Other Ambulatory Visit: Payer: Self-pay

## 2018-09-30 LAB — LACTIC ACID, PLASMA: Lactic Acid, Venous: 0.9 mmol/L (ref 0.5–1.9)

## 2018-09-30 LAB — URINALYSIS, ROUTINE W REFLEX MICROSCOPIC
Bilirubin Urine: NEGATIVE
Glucose, UA: NEGATIVE mg/dL
Ketones, ur: NEGATIVE mg/dL
Leukocytes, UA: NEGATIVE
Nitrite: NEGATIVE
Protein, ur: NEGATIVE mg/dL
SPECIFIC GRAVITY, URINE: 1.01 (ref 1.005–1.030)
pH: 7 (ref 5.0–8.0)

## 2018-09-30 LAB — BASIC METABOLIC PANEL
Anion gap: 9 (ref 5–15)
BUN: 8 mg/dL (ref 6–20)
CALCIUM: 8.4 mg/dL — AB (ref 8.9–10.3)
CO2: 23 mmol/L (ref 22–32)
Chloride: 104 mmol/L (ref 98–111)
Creatinine, Ser: 0.7 mg/dL (ref 0.44–1.00)
GFR calc Af Amer: >60 mL/min (ref >60)
Glucose, Bld: 113 mg/dL — ABNORMAL HIGH (ref 70–99)
Potassium: 3.8 mmol/L (ref 3.5–5.1)
Sodium: 136 mmol/L (ref 135–145)

## 2018-09-30 LAB — CBC
HCT: 34.6 % — ABNORMAL LOW (ref 36.0–46.0)
Hemoglobin: 11.4 g/dL — ABNORMAL LOW (ref 12.0–15.0)
MCH: 29.5 pg (ref 26.0–34.0)
MCHC: 32.9 g/dL (ref 30.0–36.0)
MCV: 89.4 fL (ref 80.0–100.0)
Platelets: 251 10*3/uL (ref 150–400)
RBC: 3.87 MIL/uL (ref 3.87–5.11)
RDW: 13.3 % (ref 11.5–15.5)
WBC: 13.8 10*3/uL — ABNORMAL HIGH (ref 4.0–10.5)
nRBC: 0 % (ref 0.0–0.2)

## 2018-09-30 MED ORDER — BREAST MILK: ORAL | Status: DC

## 2018-09-30 MED ORDER — SODIUM CHLORIDE 0.9 % IV BOLUS
500.0000 mL | Freq: Once | INTRAVENOUS | Status: AC
  Administered 2018-09-30: 500 mL via INTRAVENOUS

## 2018-09-30 MED ORDER — BREAST MILK
ORAL | Status: DC
  Filled 2018-09-30: qty 1

## 2018-09-30 NOTE — Progress Notes (Signed)
Notified MD on call of pt temp still 101 after tylenol given earlier and too soon to re-medicate, no new orders given and also notified of pt bp of 96/54 with fluids running at 100 of NS . No new orders at this time, will cont to monitor

## 2018-09-30 NOTE — Progress Notes (Signed)
Pt continues pumping with lactation every 3 hours. Breastfeeding on right breast, pumping from left with more output. Tolerates abx with no adverse effect. FOB at bedside, supportive.

## 2018-09-30 NOTE — Progress Notes (Signed)
[facility] [place] [facility]   PATIENT NAME: Bridget Cline    MR#:  [id number]  DATE OF BIRTH:  10-29-1991  SUBJECTIVE:  CHIEF COMPLAINT:   Chief Complaint  Patient presents with  . Breast Pain   - admitted with left breast mastitis - still febrile today  REVIEW OF SYSTEMS:  Review of Systems  Constitutional: Positive for fever and malaise/fatigue. Negative for chills.  Eyes: Negative for blurred vision, double vision and photophobia.  Respiratory: Negative for cough, shortness of breath and wheezing.   Cardiovascular: Negative for chest pain and palpitations.  Gastrointestinal: Negative for abdominal pain, constipation, diarrhea, nausea and vomiting.  Genitourinary: Negative for dysuria.  Musculoskeletal: Positive for myalgias.  Neurological: Negative for dizziness, focal weakness, seizures, weakness and headaches.  Psychiatric/Behavioral: Negative for suicidal ideas.    DRUG ALLERGIES:  No Known Allergies  VITALS:  Blood pressure (!) 80/47, pulse 90, temperature 99.2 F (37.3 C), temperature source Oral, resp. rate 18, height 5' (1.524 m), weight 64.9 kg, SpO2 96 %, currently breastfeeding.  PHYSICAL EXAMINATION:  Physical Exam  GENERAL:  27 y.o.-year-old patient lying in the bed with no acute distress.  EYES: Pupils equal, round, reactive to light and accommodation. No scleral icterus. Extraocular muscles intact.  HEENT: Head atraumatic, normocephalic. Oropharynx and nasopharynx clear.  NECK:  Supple, no jugular venous distention. No thyroid enlargement, no tenderness.  LUNGS: Normal breath sounds bilaterally, no wheezing, rales,rhonchi or crepitation. No use of accessory muscles of respiration.  BREASTS- left breast with diffuse erythema and swelling, soft to palpate, tender CARDIOVASCULAR: S1, S2 normal. No murmurs, rubs, or gallops.  ABDOMEN: Soft, nontender, nondistended. Bowel sounds present. No organomegaly or mass.  EXTREMITIES:  No pedal edema, cyanosis, or clubbing.  NEUROLOGIC: Cranial nerves II through XII are intact. Muscle strength 5/5 in all extremities. Sensation intact. Gait not checked.  PSYCHIATRIC: The patient is alert and oriented x 3.  SKIN: No obvious rash, lesion, or ulcer.    LABORATORY PANEL:   CBC Recent Labs  Lab 09/30/18 0634  WBC 13.8*  HGB 11.4*  HCT 34.6*  PLT 251   ------------------------------------------------------------------------------------------------------------------  Chemistries  Recent Labs  Lab 09/29/18 1408 09/30/18 0634  NA 133* 136  K 3.6 3.8  CL 96* 104  CO2 24 23  GLUCOSE 140* 113*  BUN 12 8  CREATININE 0.74 0.70  CALCIUM 8.8* 8.4*  AST 26  --   ALT 23  --   ALKPHOS 68  --   BILITOT 1.0  --    ------------------------------------------------------------------------------------------------------------------  Cardiac Enzymes No results for input(s): TROPONINI in the last 168 hours. ------------------------------------------------------------------------------------------------------------------  RADIOLOGY:  US Breast Ltd Uni Left Inc Axilla  Result Date: 09/29/2018 CLINICAL DATA:  Pain and redness of the left breast for 24 hours. Breastfeeding patient. EXAM: ULTRASOUND OF THE LEFT BREAST COMPARISON:  None. FINDINGS: Static images from targeted left breast ultrasound demonstrate breast edema with trabecular and skin thickening. No drainable fluid collections are seen. IMPRESSION: Left breast edema without drainable fluid collections. Findings are most consistent with acute mastitis. RECOMMENDATION: Clinical follow-up. I have discussed the findings and recommendations with the patient. Results were also provided in writing at the conclusion of the visit. If applicable, a reminder letter will be sent to the patient regarding the next appointment. BI-RADS CATEGORY  2: Benign. Electronically Signed   By: Ted Mcalpine M.D.   On: 09/29/2018 15:56     EKG:   Orders placed or performed during the  hospital encounter of 09/29/18  . ED EKG 12-Lead  . ED EKG 12-Lead    ASSESSMENT AND PLAN:   27 y/o female with no significant past medical history who is 27 weeks post partum admitted with left breast mastitis.  1. Sepsis-secondary to left breast mastitis. -Continues to have fevers.  IV fluids.  Continue to express left breast, pumping and dumping recommended -Appreciate lactation RN consult -Continue IV antibiotics.  Patient on vancomycin and Zosyn now -Cultures are pending.  2.  Postpartum 3 weeks ago-continue prenatal vitamins.  3.  Leukocytosis-secondary to infection   Up and ambulatory   All the records are reviewed and case discussed with Care Management/Social Workerr. Management plans discussed with the patient, family and they are in agreement.  CODE STATUS: Full Code  TOTAL TIME TAKING CARE OF THIS PATIENT: 37 minutes.   POSSIBLE D/C IN 2 DAYS, DEPENDING ON CLINICAL CONDITION.   Enid BaasKALISETTI,Alanzo Lamb M.D on 09/30/2018 at 12:48 PM  Between 7am to 6pm - Pager - [phone]  After 6pm go to [email or web address] - password [email or web address]  [facility]  Office  [phone]  CC: Primary care physician; Patient, No Pcp Per

## 2018-09-30 NOTE — Lactation Note (Signed)
Lactation Consultation Note  Patient Name: Bridget NancyHilda Sierra Lopez ZOXWR'UToday's Date: 09/30/2018   Mom's left breast is hard, warm to touch, red and painful.  Mom has consistently put her 4420 day old newborn to the right breast any time she demonstrates hunger cues.  Mom declines to put her to the left breast for now d/t severe pain.  [place]ola is too hard on the left breast to compress even with reverse pressure.Throughout the day today Lactation has assisted mom every 3 hours with warmth, massage, hand expression, pumping and application of cabbage leaves afterward to affected left breast.  At the same time LC is assisting with pumping left breast, mom is breast feeding from her right breast in football and modified cradle hold.  Mom pumps right breast after breast feeding and has expressed from 5 to 20 ml after breast feeding which has been given via SNS at the right breast.  Denies any pain from right breast.  During first pumping today, mom expressed congealed clumps of milk.  Since then, she has only expressed a drop or 2 of mature milk at a pumping.  Mom reports at tonight's 9 pm pumping that her left breast is beginning to soften and feel much better, but is still warm to touch, red and too painful to breast feed.  Mom requesting to get in shower because she thinks it will help her sore left breast. Told mom may be possible later tonight or tomorrow. Mom reports breast feeding her now 27 year old for 1 year and 3 months without any complications.  Mom and father of baby are compliant and committed to do what ever it takes to relieve the mastitis.  Discussed earlier today with spanish interpreter how to prevent reoccurence.    Maternal Data    Feeding    LATCH Score                   Interventions    Lactation Tools Discussed/Used     Consult Status      Louis MeckelWilliams, Tarry Blayney Kay 09/30/2018, 9:49 PM

## 2018-10-01 LAB — BASIC METABOLIC PANEL
Anion gap: 6 (ref 5–15)
BUN: 6 mg/dL (ref 6–20)
CHLORIDE: 110 mmol/L (ref 98–111)
CO2: 24 mmol/L (ref 22–32)
Calcium: 8.6 mg/dL — ABNORMAL LOW (ref 8.9–10.3)
Creatinine, Ser: 0.55 mg/dL (ref 0.44–1.00)
GFR calc Af Amer: >60 mL/min (ref >60)
GFR calc non Af Amer: >60 mL/min (ref >60)
Glucose, Bld: 98 mg/dL (ref 70–99)
POTASSIUM: 3.6 mmol/L (ref 3.5–5.1)
Sodium: 140 mmol/L (ref 135–145)

## 2018-10-01 LAB — CBC
HCT: 30 % — ABNORMAL LOW (ref 36.0–46.0)
Hemoglobin: 9.8 g/dL — ABNORMAL LOW (ref 12.0–15.0)
MCH: 29.4 pg (ref 26.0–34.0)
MCHC: 32.7 g/dL (ref 30.0–36.0)
MCV: 90.1 fL (ref 80.0–100.0)
Platelets: 236 10*3/uL (ref 150–400)
RBC: 3.33 MIL/uL — ABNORMAL LOW (ref 3.87–5.11)
RDW: 13.3 % (ref 11.5–15.5)
WBC: 8.4 10*3/uL (ref 4.0–10.5)
nRBC: 0 % (ref 0.0–0.2)

## 2018-10-01 LAB — URINE CULTURE: Culture: <10000 — AB

## 2018-10-01 LAB — VANCOMYCIN, TROUGH: Vancomycin Tr: 9 ug/mL — ABNORMAL LOW (ref 15–20)

## 2018-10-01 MED ORDER — VANCOMYCIN HCL IN DEXTROSE 750-5 MG/150ML-% IV SOLN
750.0000 mg | Freq: Three times a day (TID) | INTRAVENOUS | Status: DC
  Administered 2018-10-01: 750 mg via INTRAVENOUS
  Filled 2018-10-01 (×3): qty 150

## 2018-10-01 MED ORDER — OXYCODONE HCL 5 MG PO TABS: 5.0000 mg | ORAL_TABLET | Freq: Four times a day (QID) | ORAL | 0 refills | Status: DC | PRN

## 2018-10-01 MED ORDER — CLINDAMYCIN HCL 150 MG PO CAPS
300.0000 mg | ORAL_CAPSULE | Freq: Three times a day (TID) | ORAL | Status: DC
  Administered 2018-10-01: 300 mg via ORAL
  Filled 2018-10-01 (×2): qty 2

## 2018-10-01 MED ORDER — CLINDAMYCIN HCL 300 MG PO CAPS: 300.0000 mg | ORAL_CAPSULE | Freq: Three times a day (TID) | ORAL | 0 refills | Status: AC

## 2018-10-01 NOTE — Progress Notes (Signed)
Pharmacy Antibiotic Note  Bridget Cline is a 27 y.o. female admitted on 09/29/2018 with sepsis.  Pharmacy has been consulted for Vancomycin, Zosyn dosing.  Plan: Vancomycin trough measured at 9 mcg/ml. Level drawn 4 hours too soon so adjusted level closer to 7 mcg/ml. Increase to vancomycin 750 mg IV Q8H, predicted trough 15 mcg/ml. Pharmacy will continue to follow and adjust as needed to maintain trough 10 to 15 mcg/ml.   Vd 37.3 L, Ke 0.117 hr-1, T1/2 5.9 hr  Continue Zosyn as ordered.   Height: 5' (152.4 cm) Weight: 143 lb (64.9 kg) IBW/kg (Calculated) : 45.5  Temp (24hrs), Avg:99.5 F (37.5 C), Min:98.4 F (36.9 C), Max:101.2 F (38.4 C)  Recent Labs  Lab 09/29/18 1408 09/29/18 1414 09/29/18 1858 09/30/18 0634 10/01/18 0748  WBC 15.6*  --   --  13.8* 8.4  CREATININE 0.74  --   --  0.70 0.55  LATICACIDVEN  --  2.04* 1.22 0.9  --   VANCOTROUGH  --   --   --   --  9*    Estimated Creatinine Clearance: 88.9 mL/min (by C-G formula based on SCr of 0.55 mg/dL).    No Known Allergies  Antimicrobials this admission:   >>    >>   Dose adjustments this admission:   Microbiology results:  BCx:   UCx:    Sputum:    MRSA PCR:   Thank you for allowing pharmacy to be a part of this patient's care.  Carola FrostNathan A Ashanti Ratti, Pharm.D., BCPS Clinical Pharmacist 10/01/2018 9:17 AM

## 2018-10-01 NOTE — Discharge Summary (Signed)
[facility]   PATIENT NAME: Bridget Cline    MR#:  [id number]  DATE OF BIRTH:  26-Nov-1990  DATE OF ADMISSION:  09/29/2018   ADMITTING PHYSICIAN: Campbell StallKaty Dodd Mayo, MD  DATE OF DISCHARGE: 10/01/18  PRIMARY CARE PHYSICIAN: Patient, No Pcp Per   ADMISSION DIAGNOSIS:   Mastitis [N61.0] Sepsis without acute organ dysfunction, due to unspecified organism (HCC) [A41.9]  DISCHARGE DIAGNOSIS:   Active Problems:   Sepsis (HCC)   SECONDARY DIAGNOSIS:   Past Medical History:  Diagnosis Date  . Medical history non-contributory     HOSPITAL COURSE:   27 y/o female with no significant past medical history who is 3 weeks post partum admitted with left breast mastitis.  1. Sepsis-secondary to left breast mastitis. - didn't finish her outpatient antibiotics - no fevers since last night, feels much better today - significant improvement in tenderness, warmth and erythema of left breast - Continue to express left breast, pumping and dumping recommended -Appreciate lactation RN consult -received IV antibiotics with vancomycin and Zosyn here- discharge on oral clindamycin -Blood Cultures are negative so far.  2.  Postpartum 3 weeks ago-continue prenatal vitamins.  3.  Leukocytosis-secondary to infection- improved now  Independent at baseline, discharge today   DISCHARGE CONDITIONS:   Guarded  CONSULTS OBTAINED:   None  DRUG ALLERGIES:   No Known Allergies DISCHARGE MEDICATIONS:   Allergies as of 10/01/2018   No Known Allergies     Medication List    STOP taking these medications   dicloxacillin 500 MG capsule Commonly known as:  DYNAPEN     TAKE these medications   clindamycin 300 MG capsule Commonly known as:  CLEOCIN Take 1 capsule (300 mg total) by mouth 3 (three) times daily for 10 days.   oxyCODONE 5 MG immediate release tablet Commonly known as:  Oxy IR/ROXICODONE Take 1-2 tablets (5-10 mg total) by  mouth every 6 (six) hours as needed for moderate pain or severe pain.   PRENATAL VITAMIN PLUS LOW IRON 27-1 MG Tabs Take 1 tablet by mouth daily.        DISCHARGE INSTRUCTIONS:   1. ObGyn f/u in 1-2 weeks  DIET:   Regular diet  ACTIVITY:   Activity as tolerated  OXYGEN:   Home Oxygen: No.  Oxygen Delivery: room air  DISCHARGE LOCATION:   home   If you experience worsening of your admission symptoms, develop shortness of breath, life threatening emergency, suicidal or homicidal thoughts you must seek medical attention immediately by calling 911 or calling your MD immediately  if symptoms less severe.  You Must read complete instructions/literature along with all the possible adverse reactions/side effects for all the Medicines you take and that have been prescribed to you. Take any new Medicines after you have completely understood and accpet all the possible adverse reactions/side effects.   Please note  You were cared for by a hospitalist during your hospital stay. If you have any questions about your discharge medications or the care you received while you were in the hospital after you are discharged, you can call the unit and asked to speak with the hospitalist on call if the hospitalist that took care of you is not available. Once you are discharged, your primary care physician will handle any further medical issues. Please note that NO REFILLS for any discharge medications will be authorized once you are discharged, as it is imperative that you return to your primary care physician (  or establish a relationship with a primary care physician if you do not have one) for your aftercare needs so that they can reassess your need for medications and monitor your lab values.    On the day of Discharge:  VITAL SIGNS:   Blood pressure 104/62, pulse 75, temperature 98 F (36.7 C), temperature source Oral, resp. rate 20, height 5' (1.524 m), weight 64.9 kg, SpO2 98 %, currently  breastfeeding.  PHYSICAL EXAMINATION:    GENERAL:  27 y.o.-year-old patient lying in the bed with no acute distress.  EYES: Pupils equal, round, reactive to light and accommodation. No scleral icterus. Extraocular muscles intact.  HEENT: Head atraumatic, normocephalic. Oropharynx and nasopharynx clear.  NECK:  Supple, no jugular venous distention. No thyroid enlargement, no tenderness.  LUNGS: Normal breath sounds bilaterally, no wheezing, rales,rhonchi or crepitation. No use of accessory muscles of respiration.  BREASTS- left breast with much improved erythema and swelling, not very tender today, not firm. Right breast is normal and soft to palpate CARDIOVASCULAR: S1, S2 normal. No murmurs, rubs, or gallops.  ABDOMEN: Soft, nontender, nondistended. Bowel sounds present. No organomegaly or mass.  EXTREMITIES: No pedal edema, cyanosis, or clubbing.  NEUROLOGIC: Cranial nerves II through XII are intact. Muscle strength 5/5 in all extremities. Sensation intact. Gait not checked.  PSYCHIATRIC: The patient is alert and oriented x 3.  SKIN: No obvious rash, lesion, or ulcer.   DATA REVIEW:   CBC Recent Labs  Lab 10/01/18 0748  WBC 8.4  HGB 9.8*  HCT 30.0*  PLT 236    Chemistries  Recent Labs  Lab 09/29/18 1408  10/01/18 0748  NA 133*   < > 140  K 3.6   < > 3.6  CL 96*   < > 110  CO2 24   < > 24  GLUCOSE 140*   < > 98  BUN 12   < > 6  CREATININE 0.74   < > 0.55  CALCIUM 8.8*   < > 8.6*  AST 26  --   --   ALT 23  --   --   ALKPHOS 68  --   --   BILITOT 1.0  --   --    < > = values in this interval not displayed.     Microbiology Results  Results for orders placed or performed during the hospital encounter of 09/29/18  Blood culture (routine x 2)     Status: None (Preliminary result)   Collection Time: 09/29/18  2:08 PM  Result Value Ref Range Status   Specimen Description BLOOD FEMORAL ARTERY RIGHT  Final   Special Requests   Final    BOTTLES DRAWN AEROBIC AND  ANAEROBIC Blood Culture adequate volume   Culture   Final    NO GROWTH < 24 HOURS Performed at [facility], [address] [place], [place] [address]    Report Status PENDING  Incomplete  Blood culture (routine x 2)     Status: None (Preliminary result)   Collection Time: 09/29/18  2:39 PM  Result Value Ref Range Status   Specimen Description BLOOD LEFT AC  Final   Special Requests   Final    BOTTLES DRAWN AEROBIC AND ANAEROBIC Blood Culture adequate volume   Culture   Final    NO GROWTH < 24 HOURS Performed at [facility], [address], [place], [place] [address]    Report Status PENDING  Incomplete  Urine culture     Status:  Abnormal   Collection Time: 09/30/18  6:06 AM  Result Value Ref Range Status   Specimen Description   Final    URINE, RANDOM Performed at [facility], [address] [place], [place] [address]    Special Requests   Final    NONE Performed at [facility], [address] [place], [place] [address]    Culture (A)  Final    <10,000 COLONIES/mL INSIGNIFICANT GROWTH Performed at [facility], [address] [place], [place] [address]    Report Status 10/01/2018 FINAL  Final    RADIOLOGY:  No results found.   Management plans discussed with the patient, family and they are in agreement.  CODE STATUS:     Code Status Orders  (From admission, onward)         Start     Ordered   09/29/18 2022  Full code  Continuous     09/29/18 2021        Code Status History    Date Active Date Inactive Code Status Order ID Comments User Context   09/08/2018 2313 09/10/2018 2249 Full Code [id number]  Tresea Mall, CNM Inpatient   09/08/2018 1124 09/08/2018 2313 Full Code [id number]  Tresea Mall, CNM Inpatient      TOTAL TIME TAKING CARE OF THIS PATIENT: 38 minutes.    Enid Baas M.D on 10/01/2018 at 2:23 PM  Between 7am to 6pm - Pager - [phone]  After 6pm go to [email or web address] - Geophysicist/field seismologist  [facility]  Office  [phone]  CC: Primary care physician; Patient, No Pcp Per   Note: This dictation was prepared with Dragon dictation along with smaller phrase technology. Any transcriptional errors that result from this process are unintentional.

## 2018-10-01 NOTE — Lactation Note (Addendum)
Lactation Consultation Note  Patient Name: Bridget Cline ZOXWR'UToday's Date: 10/01/2018   Mom has continued to put baby to the right breast whenever she exhibits hunger cues and nurses until she is satiated.  Mom's left breast is still red, but not as much.  Mom's breast is much softer with the nipple everted more today than yesterday.  Mom reports less pain.  Mom still choosing to put baby to right breast only, but committed to pump left breast every 3 hours.  Mom given lactation direct line and office number to call for consult when ready to put baby back to the left breast.  Discussed other lactation community resources available after discharge.  Mom pumped using #27 flange and got a couple of drops of mature milk.  Mom already has a [employer] DEBP at home through WIC/MCD.  Encouraged mom to continue to massage, hand express and pump left breast.  Mom wants to continue to use the cabbage leaves after breast feeding for another day or so.  She feels it is continuing to make left breast softer and more comfortable.  Praised mom for her continued commitment to breast feed her baby through the pain of mastitis.  Encouraged mom to call with any questions, concerns or assistance.       Maternal Data    Feeding    LATCH Score                   Interventions    Lactation Tools Discussed/Used     Consult Status      Bridget Cline, Bridget Cline 10/01/2018, 3:32 PM

## 2018-10-01 NOTE — Progress Notes (Signed)
Discharge order received from doctor. Reviewed discharge instructions and prescriptions with patient using an interpreter and answered all questions. Follow up appointment given. Patient verbalized understanding. Patient discharged home via wheelchair by nursing/auxillary.    Oswald HillockAbigail Garner, RN

## 2018-10-02 LAB — HIV ANTIBODY (ROUTINE TESTING W REFLEX): HIV Screen 4th Generation wRfx: NONREACTIVE

## 2018-10-04 LAB — CULTURE, BLOOD (ROUTINE X 2)
Culture: NO GROWTH
Culture: NO GROWTH
Special Requests: ADEQUATE
Special Requests: ADEQUATE

## 2018-10-08 ENCOUNTER — Ambulatory Visit: Payer: Medicaid Other | Admitting: Advanced Practice Midwife

## 2018-10-12 ENCOUNTER — Ambulatory Visit (INDEPENDENT_AMBULATORY_CARE_PROVIDER_SITE_OTHER): Payer: Medicaid Other | Admitting: Advanced Practice Midwife

## 2018-10-12 ENCOUNTER — Encounter: Payer: Self-pay | Admitting: Advanced Practice Midwife

## 2018-10-12 VITALS — BP 100/60 | Ht 60.0 in | Wt 151.0 lb

## 2018-10-12 DIAGNOSIS — Z09 Encounter for follow-up examination after completed treatment for conditions other than malignant neoplasm: Secondary | ICD-10-CM | POA: Diagnosis not present

## 2018-10-12 NOTE — Progress Notes (Signed)
Patient ID: Bridget Cline, female   DOB: 1991/02/04, 27 y.o.   MRN: [id number]  Reason for Consult: Mastitis follow up    Subjective:     HPI:  Bridget Cline is a 27 y.o. female here for follow up from treatment for mastitis. She was seen at the hospital on 09/29/2018 and diagnosed with sepsis secondary to mastitis of left breast. She had been diagnosed with mastitis about 5 days earlier presumably at the health department. She had only taken 4 days of her dicloxacillin Rx when she stopped and then her pain returned. She was treated over the course of 3 [facility] at [facility] and then discharged home with PO Clindamycin TID for 10 days. She is here today for follow up and has no current complaints. She denies any pain or swelling in her left breast. She reports she has taken all of her antibiotic Rx. She is still breastfeeding q 3 hours for 20 minutes at each breast. She is also supplementing those feedings with 2-4 ounces of formula because the baby is fussy after nursing.   Past Medical History:  Diagnosis Date  . Medical history non-contributory    History reviewed. No pertinent family history. Past Surgical History:  Procedure Laterality Date  . NO PAST SURGERIES      Short Social History:  Social History   Tobacco Use  . Smoking status: Never Smoker  . Smokeless tobacco: Never Used  Substance Use Topics  . Alcohol use: Never    Frequency: Never    No Known Allergies  Current Outpatient Medications  Medication Sig Dispense Refill  . oxyCODONE (OXY IR/ROXICODONE) 5 MG immediate release tablet Take 1-2 tablets (5-10 mg total) by mouth every 6 (six) hours as needed for moderate pain or severe pain. (Patient not taking: Reported on 10/12/2018) 20 tablet 0  . Prenatal Vit-Fe Fumarate-FA (PRENATAL VITAMIN PLUS LOW IRON) 27-1 MG TABS Take 1 tablet by mouth daily.  11   No current facility-administered medications for this visit.     Review of Systems  Constitutional:   Constitutional negative. HENT: HENT negative.  Eyes: Eyes negative.  Respiratory: Respiratory negative.  Cardiovascular: Cardiovascular negative.  GI: Gastrointestinal negative.  GU: Genitourinary negative. Musculoskeletal: Musculoskeletal negative.  Skin: Skin negative.  Neurological: Neurological negative. Hematologic: Hematologic/lymphatic negative.  Psychiatric: Psychiatric negative.  Breast: negative for pain, swelling, warmth      Objective:  Objective   Vitals:   10/12/18 1544  BP: 100/60  Weight: 151 lb (68.5 kg)  Height: 5' (1.524 m)   Body mass index is 29.49 kg/m.  Vital Signs: BP 100/60   Ht 5' (1.524 m)   Wt 151 lb (68.5 kg)   BMI 29.49 kg/m  Constitutional: Well nourished, well developed female in no acute distress.  HEENT: normal Skin: Warm and dry.  Breast: left breast is warm to touch/not hot, diffusely pink, no localized area of redness or heat, no tenderness to palpation, no swelling or lumps, right breast is non-tender, no redness, no heat, no swelling Cardiovascular: Regular rate and rhythm.   Extremity: no evidence of DVT  Respiratory: Clear to auscultation bilateral. Normal respiratory effort Psych: Alert and Oriented x3. No memory deficits. Normal mood and affect.        Assessment/Plan:     27 yo G2 [id number] female with mastitis follow up, no current concern for mastitis  Continue breastfeeding Stay well hydrated Massage affected milk ducts Warm compresses to affected area  Go to Health Department for  6 week follow up visit    Tresea Mall CNM [facility], [facility]

## 2018-10-22 DIAGNOSIS — E663 Overweight: Secondary | ICD-10-CM | POA: Insufficient documentation

## 2018-12-07 DIAGNOSIS — E663 Overweight: Secondary | ICD-10-CM

## 2019-03-01 NOTE — Progress Notes (Addendum)
Documentation on this chart in error. Did not see this patient.  Carlyle Basques RN MSN

## 2020-01-02 ENCOUNTER — Telehealth: Payer: Self-pay | Admitting: Family Medicine

## 2020-01-02 NOTE — Telephone Encounter (Signed)
Patient is asking to speak to doctor newton, as she would come for her pregnancy care here and would like to ask her some questions regarding lactation.

## 2020-01-02 NOTE — Telephone Encounter (Signed)
The patient can establish her prenatal care and we can talk about any breastfeeding/lactation concerns at that visit.  We are happy to answer these questions. If she feel comfortable sharing the questions with you it would help me understand what she needs help with or otherwise one our nurses can call her to see what her lactation concerns are.

## 2020-01-03 NOTE — Telephone Encounter (Signed)
Attempted to call re: question via interpreter M. [place]; no answer, left voicemail message that may call back if continues to have question Sharlette Dense, RN

## 2020-01-17 IMAGING — US US BREAST*L* LIMITED INC AXILLA
1 series · 8 of 8 positions shown · non-contrast
Comparison: None.

CLINICAL DATA: Pain and redness of the left breast for 24 hours.
Breastfeeding patient.

EXAM:
ULTRASOUND OF THE LEFT BREAST

[Series 1: us breast*left* limited inc axilla · 8 of 8 slices shown]
[im 1/8]
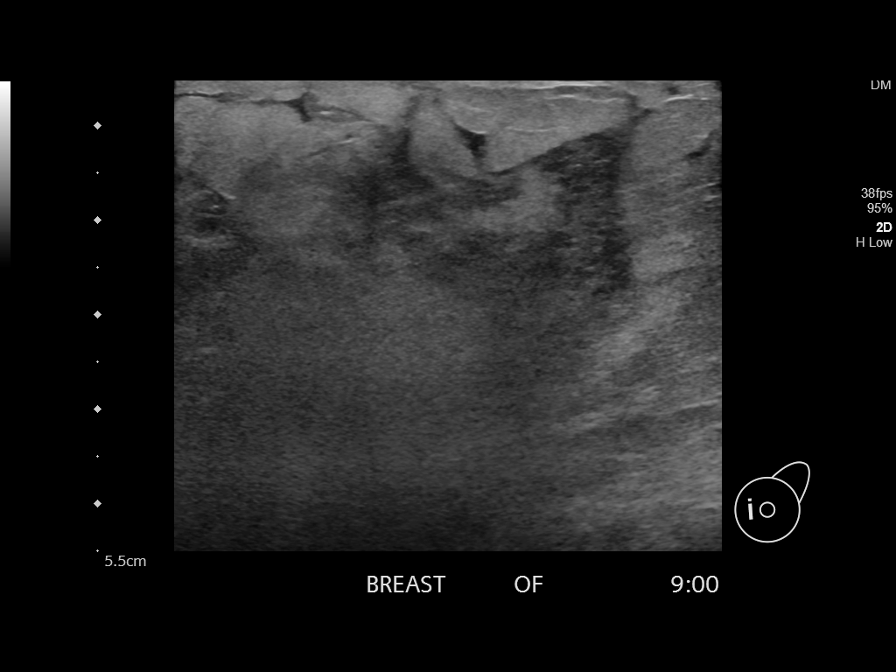
[im 2/8]
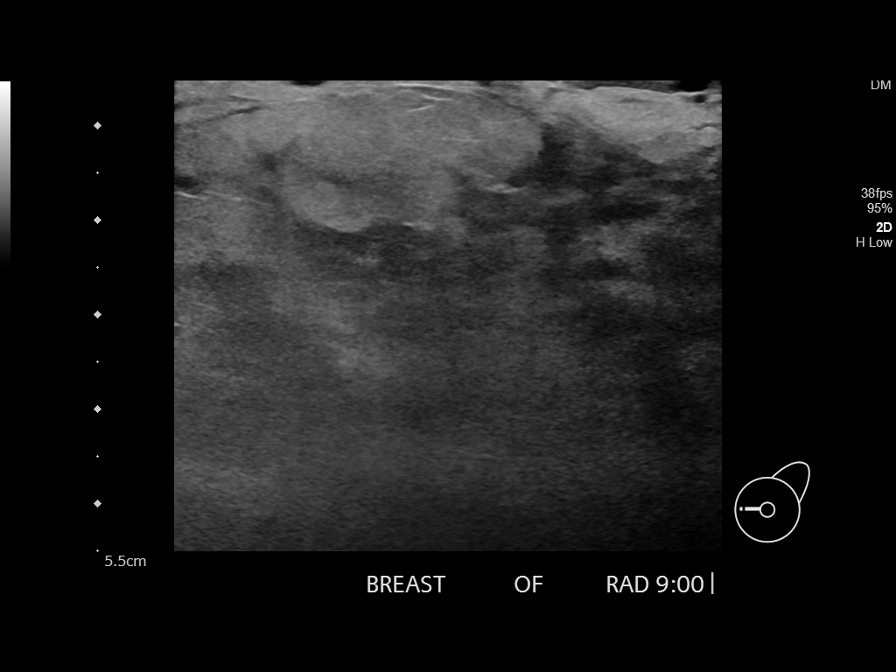
[im 3/8]
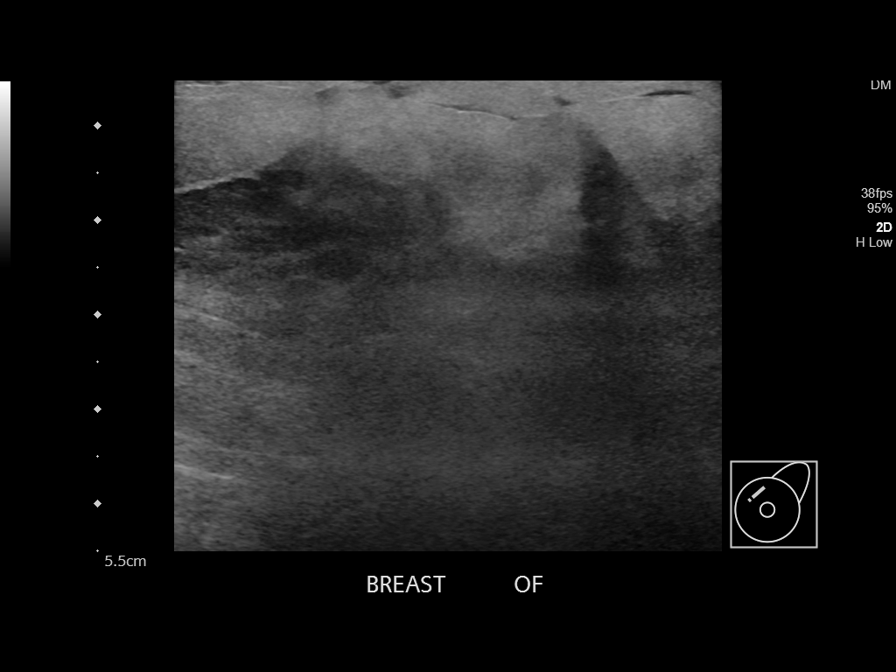
[im 4/8]
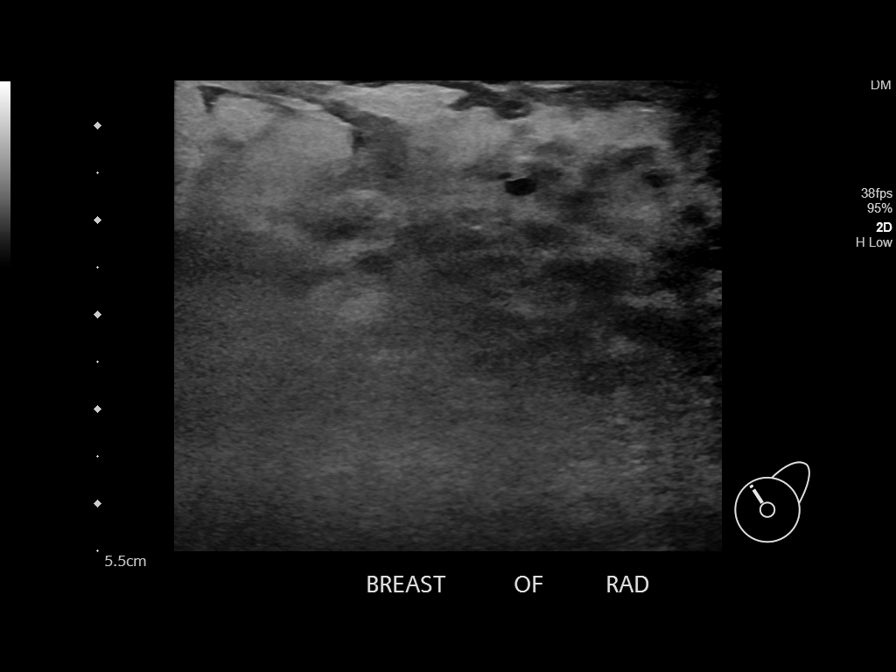
[im 5/8]
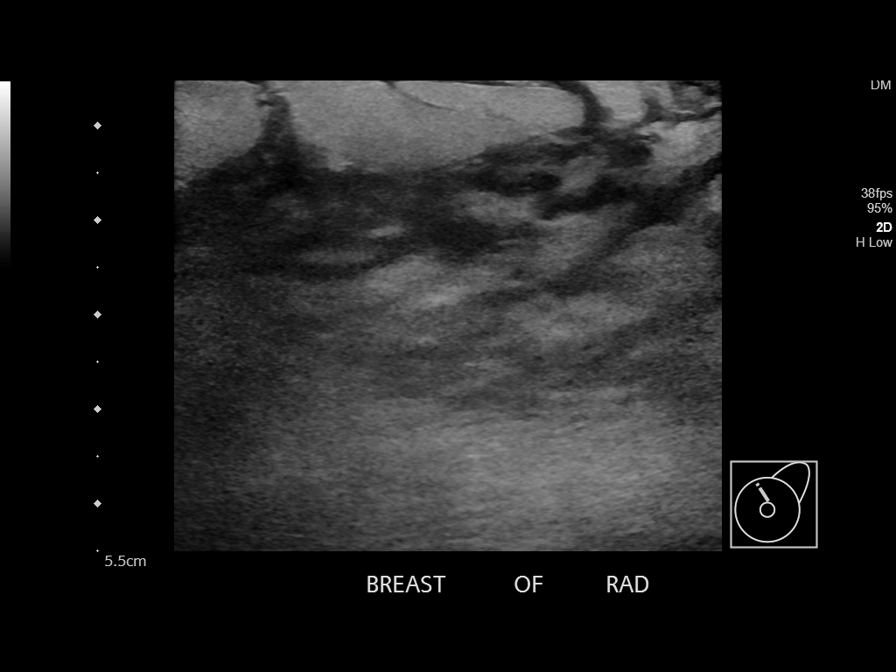
[im 6/8]
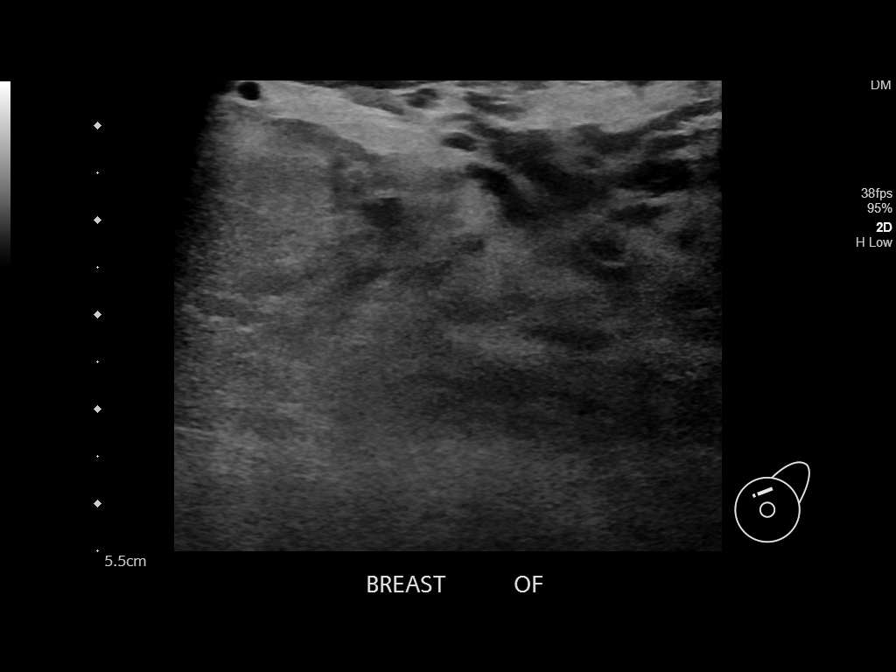
[im 7/8]
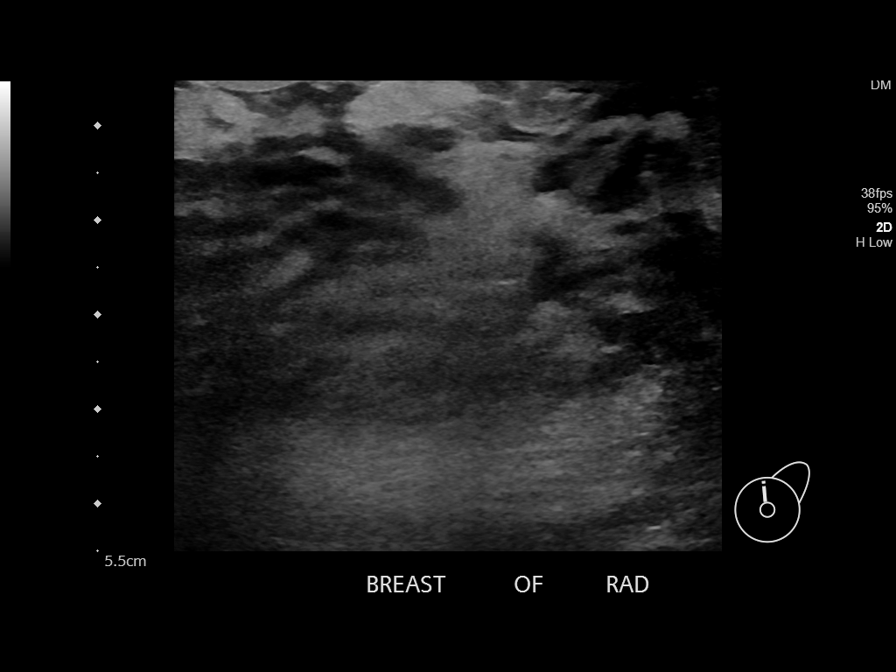
[im 8/8]
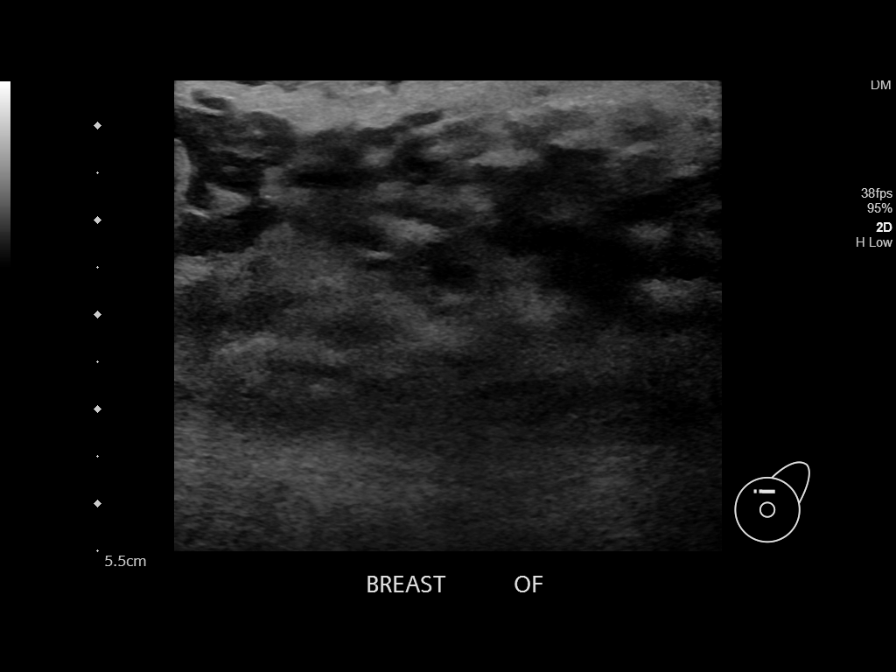

[8 of 8 positions shown; findings below may reference images not displayed]

FINDINGS: Static images from targeted left breast ultrasound demonstrate
breast edema with trabecular and skin thickening. No drainable fluid
collections are seen.
IMPRESSION: Left breast edema without drainable fluid collections. Findings are
most consistent with acute mastitis.

RECOMMENDATION:
Clinical follow-up.

I have discussed the findings and recommendations with the patient.
Results were also provided in writing at the conclusion of the
visit. If applicable, a reminder letter will be sent to the patient
regarding the next appointment.

BI-RADS CATEGORY  2: Benign.

## 2020-02-20 ENCOUNTER — Ambulatory Visit: Payer: Medicaid Other

## 2020-02-22 ENCOUNTER — Ambulatory Visit: Payer: MEDICAID | Attending: Internal Medicine

## 2020-02-22 DIAGNOSIS — Z23 Encounter for immunization: Secondary | ICD-10-CM

## 2020-02-22 NOTE — Progress Notes (Signed)
   Covid-19 Vaccination Clinic  Name:  Tamekia Rotter    MRN: [id number] DOB: [birth year]  02/22/2020  Ms. Gwinda Passe was observed post Covid-19 immunization for 15 minutes without incident. She was provided with Vaccine Information Sheet and instruction to access the V-Safe system.   Ms. Gwinda Passe was instructed to call 911 with any severe reactions post vaccine: Marland Kitchen Difficulty breathing  . Swelling of face and throat  . A fast heartbeat  . A bad rash all over body  . Dizziness and weakness   Immunizations Administered    Name Date Dose VIS Date Route   Pfizer COVID-19 Vaccine 02/22/2020  1:39 PM 0.3 mL 12/25/2018 Intramuscular   Manufacturer: [employer], [employer]   Lot: [id number]   NDC: [address]-9

## 2020-03-16 ENCOUNTER — Ambulatory Visit: Payer: MEDICAID | Attending: Internal Medicine

## 2020-03-21 ENCOUNTER — Ambulatory Visit: Payer: MEDICAID | Attending: Internal Medicine

## 2020-03-21 DIAGNOSIS — Z23 Encounter for immunization: Secondary | ICD-10-CM

## 2020-03-21 NOTE — Progress Notes (Signed)
   Covid-19 Vaccination Clinic  Name:  Bridget Cline    MRN: [id number] DOB: 1991/04/02  03/21/2020  Bridget Cline was observed post Covid-19 immunization for 15 minutes without incident. She was provided with Vaccine Information Sheet and instruction to access the V-Safe system.   Bridget Cline was instructed to call 911 with any severe reactions post vaccine: Marland Kitchen Difficulty breathing  . Swelling of face and throat  . A fast heartbeat  . A bad rash all over body  . Dizziness and weakness   Immunizations Administered    Name Date Dose VIS Date Route   Pfizer COVID-19 Vaccine 03/21/2020  9:36 AM 0.3 mL 12/25/2018 Intramuscular   Manufacturer: [employer], [employer]   Lot: [id number]   NDC: [address]-7

## 2020-11-10 ENCOUNTER — Other Ambulatory Visit: Payer: Self-pay

## 2021-05-21 ENCOUNTER — Emergency Department
Admission: EM | Admit: 2021-05-21 | Discharge: 2021-05-21 | Disposition: A | Discharge status: Home / Self Care | Payer: No Typology Code available for payment source | Attending: Student in an Organized Health Care Education/Training Program | Admitting: Student in an Organized Health Care Education/Training Program

## 2021-05-21 ENCOUNTER — Other Ambulatory Visit: Payer: Self-pay

## 2021-05-21 ENCOUNTER — Encounter: Payer: Self-pay | Admitting: Emergency Medicine

## 2021-05-21 DIAGNOSIS — Y990 Civilian activity done for income or pay: Secondary | ICD-10-CM | POA: Insufficient documentation

## 2021-05-21 DIAGNOSIS — S3992XA Unspecified injury of lower back, initial encounter: Secondary | ICD-10-CM | POA: Diagnosis present

## 2021-05-21 DIAGNOSIS — X500XXA Overexertion from strenuous movement or load, initial encounter: Secondary | ICD-10-CM | POA: Insufficient documentation

## 2021-05-21 DIAGNOSIS — S39012A Strain of muscle, fascia and tendon of lower back, initial encounter: Secondary | ICD-10-CM | POA: Insufficient documentation

## 2021-05-21 MED ORDER — MELOXICAM 15 MG PO TABS: 15.0000 mg | ORAL_TABLET | Freq: Every day | ORAL | 2 refills | Status: DC

## 2021-05-21 MED ORDER — METHOCARBAMOL 500 MG PO TABS: 500.0000 mg | ORAL_TABLET | Freq: Three times a day (TID) | ORAL | 0 refills | Status: AC | PRN

## 2021-05-21 NOTE — ED Provider Notes (Signed)
[facility]-EMERGENCY DEPARTMENT  ____________________________________________  Time seen: Approximately 7:00 PM  I have reviewed the triage vital signs and the nursing notes.   HISTORY  Chief Complaint Back Pain   Historian Patient    HPI Bridget Cline is a 30 y.o. female presents to the emergency department with acute onset of low back pain after lifting approximately 55 pounds.  Patient denies radiation of pain.  No weakness in the legs.  No bowel or bladder incontinence or saddle anesthesia.  Patient has been able to ambulate easily since injury occurred.  No prior history of low back pain.  Patient denies possibility of pregnancy.   Past Medical History:  Diagnosis Date   Medical history non-contributory      Immunizations up to date:  Yes.     Past Medical History:  Diagnosis Date   Medical history non-contributory     Patient Active Problem List   Diagnosis Date Noted   Overweight with body mass index (BMI) 25.0-29.9 10/22/2018   Sepsis (HCC) 09/29/2018   Postpartum care following vaginal delivery 09/08/2018    Past Surgical History:  Procedure Laterality Date   NO PAST SURGERIES      Prior to Admission medications   Medication Sig Start Date End Date Taking? Authorizing Provider  meloxicam (MOBIC) 15 MG tablet Take 1 tablet (15 mg total) by mouth daily. 05/21/21 05/21/22 Yes Pia Mau M, PA-C  methocarbamol (ROBAXIN) 500 MG tablet Take 1 tablet (500 mg total) by mouth every 8 (eight) hours as needed for up to 5 days. 05/21/21 05/26/21 Yes Pia Mau M, PA-C  oxyCODONE (OXY IR/ROXICODONE) 5 MG immediate release tablet Take 1-2 tablets (5-10 mg total) by mouth every 6 (six) hours as needed for moderate pain or severe pain. Patient not taking: Reported on 10/12/2018 10/01/18   Enid Baas, MD  Prenatal Vit-Fe Fumarate-FA (PRENATAL VITAMIN PLUS LOW IRON) 27-1 MG TABS Take 1 tablet by mouth daily. 09/06/18   [provider]     Allergies Patient has no known allergies.  No family history on file.  Social History Social History   Tobacco Use   Smoking status: Never   Smokeless tobacco: Never  Substance Use Topics   Alcohol use: Never   Drug use: Never     Review of Systems  Constitutional: No fever/chills Eyes:  No discharge ENT: No upper respiratory complaints. Respiratory: no cough. No SOB/ use of accessory muscles to breath Gastrointestinal:   No nausea, no vomiting.  No diarrhea.  No constipation. Musculoskeletal: Patient has low back pain.  Skin: Negative for rash, abrasions, lacerations, ecchymosis.    ____________________________________________   PHYSICAL EXAM:  VITAL SIGNS: ED Triage Vitals  Enc Vitals Group     BP 05/21/21 1725 139/74     Pulse Rate 05/21/21 1725 83     Resp 05/21/21 1725 20     Temp 05/21/21 1725 98.9 F (37.2 C)     Temp Source 05/21/21 1725 Oral     SpO2 05/21/21 1725 100 %     Weight 05/21/21 1723 158 lb 11.7 oz (72 kg)     Height 05/21/21 1723 5' (1.524 m)     Head Circumference --      Peak Flow --      Pain Score 05/21/21 1723 8     Pain Loc --      Pain Edu? --      Excl. in GC? --      Constitutional: Alert and oriented. Well appearing  and in no acute distress. Eyes: Conjunctivae are normal. PERRL. EOMI. Head: Atraumatic. ENT:  Cardiovascular: Normal rate, regular rhythm. Normal S1 and S2.  Good peripheral circulation. Respiratory: Normal respiratory effort without tachypnea or retractions. Lungs CTAB. Good air entry to the bases with no decreased or absent breath sounds Gastrointestinal: Bowel sounds x 4 quadrants. Soft and nontender to palpation. No guarding or rigidity. No distention. Musculoskeletal: Full range of motion to all extremities. No obvious deformities noted.  Patient has paraspinal muscle tenderness along the lumbar spine. Neurologic:  Normal for age. No gross focal neurologic deficits are appreciated.  Skin:  Skin is  warm, dry and intact. No rash noted. Psychiatric: Mood and affect are normal for age. Speech and behavior are normal.   ____________________________________________   LABS (all labs ordered are listed, but only abnormal results are displayed)  Labs Reviewed - No data to display ____________________________________________  EKG   ____________________________________________  RADIOLOGY   No results found.  ____________________________________________    PROCEDURES  Procedure(s) performed:     Procedures     Medications - No data to display   ____________________________________________   INITIAL IMPRESSION / ASSESSMENT AND PLAN / ED COURSE  Pertinent labs & imaging results that were available during my care of the patient were reviewed by me and considered in my medical decision making (see chart for details).      Assessment and plan Low back pain 30 year old female presents to the emergency department with midline low back pain.  Vital signs were reassuring at triage.  On physical exam, patient was alert, active and nontoxic-appearing.  No neurodeficits were noted on exam.  Patient was discharged with meloxicam and Robaxin.  All patient questions were answered.     ____________________________________________  FINAL CLINICAL IMPRESSION(S) / ED DIAGNOSES  Final diagnoses:  Strain of lumbar region, initial encounter      NEW MEDICATIONS STARTED DURING THIS VISIT:  ED Discharge Orders          Ordered    meloxicam (MOBIC) 15 MG tablet  Daily        05/21/21 1821    methocarbamol (ROBAXIN) 500 MG tablet  Every 8 hours PRN        05/21/21 1821                This chart was dictated using voice recognition software/Dragon. Despite best efforts to proofread, errors can occur which can change the meaning. Any change was purely unintentional.     Gasper Lloyd 05/21/21 Natale Milch, MD 05/21/21 2023

## 2021-05-21 NOTE — ED Triage Notes (Signed)
Per interpreter, pt was sent her for back pain related to workers comp injury to her back from heavy lifting.

## 2021-05-21 NOTE — Discharge Instructions (Addendum)
Take Meloxicam and Robaxin as directed.  

## 2021-10-31 NOTE — L&D Delivery Note (Signed)
Delivery Note  First Stage: Labor onset: 2300 Augmentation: Pitocin, cytotec, AROM Analgesia Eliezer Lofts intrapartum: IVPM Fentanyl x 1 dose.  AROM at 1914  Second Stage: Complete dilation at 0018 Onset of pushing at 0018 FHR second stage Cat II- FHR baseline 120s with mod variability, variables, + accels.   Delivery of a viable female infant on 10/09/22 at 0031 by CNM delivery of fetal head in ROA position with restitution to ROT. Tight nuchal cord;  Anterior then posterior shoulders delivered easily with gentle downward traction. Baby placed on mom's chest, and attended to by peds.  Cord double clamped after cessation of pulsation, cut by FOB Cord blood sample collected     Third Stage: Placenta delivered spontaneously intact with 3VC @ 0036 Placenta disposition: routine disposal Uterine tone Firm / bleeding small  NO vaginal, perineal or cervical laceration identified  Anesthesia for repair: n/a Repair n/a Est. Blood Loss (mL):  Complications: none  Mom to postpartum.  Baby to Couplet care / Skin to Skin.  Newborn: Birth Weight: pending  Apgar Scores: 9/9 Feeding planned: breast

## 2022-03-17 DIAGNOSIS — Z22330 Carrier of Group B streptococcus: Secondary | ICD-10-CM | POA: Diagnosis not present

## 2022-03-17 DIAGNOSIS — Z3481 Encounter for supervision of other normal pregnancy, first trimester: Secondary | ICD-10-CM | POA: Diagnosis not present

## 2022-03-23 DIAGNOSIS — O3680X0 Pregnancy with inconclusive fetal viability, not applicable or unspecified: Secondary | ICD-10-CM | POA: Diagnosis not present

## 2022-03-23 DIAGNOSIS — Z3A11 11 weeks gestation of pregnancy: Secondary | ICD-10-CM | POA: Diagnosis not present

## 2022-04-22 ENCOUNTER — Ambulatory Visit: Payer: Medicaid Other | Admitting: Advanced Practice Midwife

## 2022-04-22 ENCOUNTER — Encounter: Payer: Self-pay | Admitting: Advanced Practice Midwife

## 2022-04-22 VITALS — BP 95/49 | HR 79 | Temp 98.5°F | Ht 60.0 in | Wt 153.6 lb

## 2022-04-22 DIAGNOSIS — Z3482 Encounter for supervision of other normal pregnancy, second trimester: Secondary | ICD-10-CM

## 2022-04-22 DIAGNOSIS — O0932 Supervision of pregnancy with insufficient antenatal care, second trimester: Secondary | ICD-10-CM

## 2022-04-22 DIAGNOSIS — O0930 Supervision of pregnancy with insufficient antenatal care, unspecified trimester: Secondary | ICD-10-CM

## 2022-04-22 DIAGNOSIS — R6252 Short stature (child): Secondary | ICD-10-CM

## 2022-04-22 DIAGNOSIS — B379 Candidiasis, unspecified: Secondary | ICD-10-CM

## 2022-04-22 HISTORY — DX: Supervision of pregnancy with insufficient antenatal care, unspecified trimester: O09.30

## 2022-04-22 LAB — URINALYSIS
Bilirubin, UA: NEGATIVE
Glucose, UA: NEGATIVE
Ketones, UA: NEGATIVE
Leukocytes,UA: NEGATIVE
Nitrite, UA: NEGATIVE
Protein,UA: NEGATIVE
Specific Gravity, UA: 1.02 (ref 1.005–1.030)
Urobilinogen, Ur: 0.2 mg/dL (ref 0.2–1.0)
pH, UA: 7 (ref 5.0–7.5)

## 2022-04-22 LAB — WET PREP FOR TRICH, YEAST, CLUE: Trichomonas Exam: NEGATIVE

## 2022-04-22 LAB — HEMOGLOBIN, FINGERSTICK: Hemoglobin: 11.2 g/dL (ref 11.1–15.9)

## 2022-04-22 MED ORDER — CLOTRIMAZOLE 1 % VA CREA: 1.0000 | TOPICAL_CREAM | Freq: Every day | VAGINAL | 0 refills | Status: AC

## 2022-04-22 MED ORDER — PRENATAL MULTIVITAMIN CH: 1.0000 | ORAL_TABLET | Freq: Every day | ORAL | 0 refills | Status: DC

## 2022-04-22 NOTE — Progress Notes (Signed)
Urinalysis and hgb reviewed during clinic visit - no treatment indicated.   Wet mount reviewed - treatment given for yeast per SO.   PNV dispensed to patient per request.  Anatomy US referral faxed to [facility] with confirmation.    Earlyne Iba, RN

## 2022-04-22 NOTE — Progress Notes (Signed)
Patient here at 15w 6d for initial OB visit.   Patient lives at home with husband, Loralyn Freshwater, and 2 daughters ages 43 and 64. Loralyn Freshwater is supportive and happy with planned pregnancy.   She desires to deliver at [facility] and have [facility] as delivering provider. Patient states she went to [facility] for positive pregnancy and 2 follow-ups. Attempt to call Bernestine Amass to fax records over.   Patient has negative QFT on 07/12/18 but travelled to [place] in December 2022 for 3 weeks - will need updated QFT today.   Patient desires MaterniT21 and AFP today.

## 2022-04-24 LAB — 789231 7+OXYCODONE-BUND
Amphetamines, Urine: NEGATIVE ng/mL
BENZODIAZ UR QL: NEGATIVE ng/mL
Barbiturate screen, urine: NEGATIVE ng/mL
Cannabinoid Quant, Ur: NEGATIVE ng/mL
Cocaine (Metab.): NEGATIVE ng/mL
OPIATE SCREEN URINE: NEGATIVE ng/mL
Oxycodone/Oxymorphone, Urine: NEGATIVE ng/mL
PCP Quant, Ur: NEGATIVE ng/mL

## 2022-04-24 LAB — URINE CULTURE

## 2022-04-25 ENCOUNTER — Encounter: Payer: Self-pay | Admitting: Advanced Practice Midwife

## 2022-04-25 ENCOUNTER — Other Ambulatory Visit: Payer: Self-pay | Admitting: Advanced Practice Midwife

## 2022-04-25 ENCOUNTER — Telehealth: Payer: Self-pay

## 2022-04-25 DIAGNOSIS — O2340 Unspecified infection of urinary tract in pregnancy, unspecified trimester: Secondary | ICD-10-CM

## 2022-04-25 HISTORY — DX: Unspecified infection of urinary tract in pregnancy, unspecified trimester: O23.40

## 2022-04-25 LAB — CHLAMYDIA/GC NAA, CONFIRMATION
Chlamydia trachomatis, NAA: NEGATIVE
Neisseria gonorrhoeae, NAA: NEGATIVE

## 2022-04-25 MED ORDER — AMOXICILLIN 250 MG PO CAPS: 250.0000 mg | ORAL_CAPSULE | Freq: Three times a day (TID) | ORAL | 0 refills | Status: DC

## 2022-04-25 NOTE — Telephone Encounter (Signed)
Patient called Front Desk at [employer] asking if she should come to [facility] to pick up prescription or pick up medication at Pharmacy. Relayed message to [facility] personnel that prescription was called in to [facility] on [address] and she could pick up medication there. Delynn Flavin RN

## 2022-04-28 LAB — AFP, SERUM, OPEN SPINA BIFIDA
AFP MoM: 1.02
AFP Value: 34.2 ng/mL
Gest. Age on Collection Date: 15.9 weeks
Maternal Age At EDD: 31.2 yr
OSBR Risk 1 IN: 10000
Test Results:: NEGATIVE
Weight: 153 [lb_av]

## 2022-04-28 LAB — MATERNIT 21 PLUS CORE, BLOOD
Fetal Fraction: 7
Result (T21): NEGATIVE
Trisomy 13 (Patau syndrome): NEGATIVE
Trisomy 18 (Edwards syndrome): NEGATIVE
Trisomy 21 (Down syndrome): NEGATIVE

## 2022-04-28 LAB — CBC/D/PLT+RPR+RH+ABO+AB SCR
Antibody Screen: NEGATIVE
Basophils Absolute: 0 10*3/uL (ref 0.0–0.2)
Basos: 0 %
EOS (ABSOLUTE): 0.1 10*3/uL (ref 0.0–0.4)
Eos: 1 %
Hematocrit: 33.9 % — ABNORMAL LOW (ref 34.0–46.6)
Hemoglobin: 11.3 g/dL (ref 11.1–15.9)
Hepatitis B Surface Ag: NEGATIVE
Immature Grans (Abs): 0 10*3/uL (ref 0.0–0.1)
Immature Granulocytes: 0 %
Lymphocytes Absolute: 2.2 10*3/uL (ref 0.7–3.1)
Lymphs: 31 %
MCH: 27 pg (ref 26.6–33.0)
MCHC: 33.3 g/dL (ref 31.5–35.7)
MCV: 81 fL (ref 79–97)
Monocytes Absolute: 0.4 10*3/uL (ref 0.1–0.9)
Monocytes: 6 %
Neutrophils Absolute: 4.3 10*3/uL (ref 1.4–7.0)
Neutrophils: 62 %
Platelets: 274 10*3/uL (ref 150–450)
RBC: 4.18 x10E6/uL (ref 3.77–5.28)
RDW: 14.5 % (ref 11.7–15.4)
RPR Ser Ql: NONREACTIVE
Rh Factor: POSITIVE
WBC: 6.9 10*3/uL (ref 3.4–10.8)

## 2022-04-28 LAB — HIV-1/HIV-2 QUALITATIVE RNA
HIV-1 RNA, Qualitative: NONREACTIVE
HIV-2 RNA, Qualitative: NONREACTIVE

## 2022-04-28 LAB — QUANTIFERON-TB GOLD PLUS
QuantiFERON Mitogen Value: >10 IU/mL
QuantiFERON Nil Value: 0.3 IU/mL
QuantiFERON TB1 Ag Value: 0.23 IU/mL
QuantiFERON TB2 Ag Value: 0.24 IU/mL
QuantiFERON-TB Gold Plus: NEGATIVE

## 2022-04-28 LAB — LEAD, BLOOD (ADULT >= 16 YRS): Lead-Whole Blood: <1 ug/dL (ref 0.0–3.4)

## 2022-04-28 LAB — HCV INTERPRETATION

## 2022-04-28 LAB — HGB A1C W/O EAG: Hgb A1c MFr Bld: 5.6 % (ref 4.8–5.6)

## 2022-04-28 LAB — HCV AB W REFLEX TO QUANT PCR: HCV Ab: NONREACTIVE

## 2022-05-04 LAB — IGP, APTIMA HPV
HPV Aptima: NEGATIVE
PAP Smear Comment: 0

## 2022-05-12 NOTE — Addendum Note (Signed)
Addended by: Heywood Bene on: 05/12/2022 12:39 PM   Modules accepted: Orders

## 2022-05-20 ENCOUNTER — Encounter: Payer: Self-pay | Admitting: Nurse Practitioner

## 2022-05-20 ENCOUNTER — Telehealth: Payer: Self-pay

## 2022-05-20 ENCOUNTER — Ambulatory Visit: Payer: Medicaid Other | Admitting: Nurse Practitioner

## 2022-05-20 VITALS — BP 108/60 | HR 83 | Temp 98.7°F | Wt 164.0 lb

## 2022-05-20 DIAGNOSIS — Z3482 Encounter for supervision of other normal pregnancy, second trimester: Secondary | ICD-10-CM

## 2022-05-20 LAB — URINALYSIS
Bilirubin, UA: NEGATIVE
Glucose, UA: NEGATIVE
Ketones, UA: NEGATIVE
Leukocytes,UA: NEGATIVE
Nitrite, UA: NEGATIVE
Protein,UA: NEGATIVE
Specific Gravity, UA: 1.015 (ref 1.005–1.030)
Urobilinogen, Ur: 0.2 mg/dL (ref 0.2–1.0)
pH, UA: 7.5 (ref 5.0–7.5)

## 2022-05-20 NOTE — Telephone Encounter (Signed)
[facility] called stating they misplaced [place] referral sent earlier and to let us know patient has [place] appt on 05/23/22 at 1015.   Patient is aware of [place] on 05/23/22 and she is aware to arrive at 0945.   [place] referral faxed to [facility] today with confirmation.   Earlyne Iba, RN

## 2022-05-20 NOTE — Progress Notes (Addendum)
[facility]  PRENATAL VISIT NOTE  Subjective:  Bridget Cline is a 31 y.o. G3P2002 at [redacted]w[redacted]d being seen today for ongoing prenatal care.  She is currently monitored for the following issues for this low-risk pregnancy and has Sepsis (HCC); Late prenatal care 15 6/7; Prenatal care, subsequent pregnancy, second trimester; Short stature 5'0"; and UTI (urinary tract infection) during pregnancy 04/22/22 10-25,000 Grp B stre[ on their problem list.  Patient reports no complaints.  Contractions: Not present. Vag. Bleeding: None.  Movement: Absent. Denies leaking of fluid/ROM.   The following portions of the patient's history were reviewed and updated as appropriate: allergies, current medications, past family history, past medical history, past social history, past surgical history and problem list. Problem list updated.  Objective:   Vitals:   05/20/22 0840  BP: 108/60  Pulse: 83  Temp: 98.7 F (37.1 C)  Weight: 164 lb (74.4 kg)    Fetal Status: Fetal Heart Rate (bpm): 145 Fundal Height: 20 cm Movement: Absent     General:  Alert, oriented and cooperative. Patient is in no acute distress.  Skin: Skin is warm and dry. No rash noted.   Cardiovascular: Normal heart rate noted  Respiratory: Normal respiratory effort, no problems with respiration noted  Abdomen: Soft, gravid, appropriate for gestational age.  Pain/Pressure: Absent     Pelvic: Cervical exam deferred        Extremities: Normal range of motion.  Edema: None  Mental Status: Normal mood and affect. Normal behavior. Normal judgment and thought content.   Assessment and Plan:  Pregnancy: G3P2002 at [redacted]w[redacted]d  1. Prenatal care, subsequent pregnancy, second trimester -31 year old female in clinic today for prenatal care. -No complaints. -Patient reports taking PNV daily. -TOC today for positive GBS on 04/22/22 urine.  Advised to wipe front to back and drink at least 6-8 bottles of water.    -14 lb (6.35 kg).  Patient had an 11 lb weight gain since last visit.  Discussed with patient eating small frequent meals high in protein low in carbs, sugar, and fat.  Encouraged to drink at least 6-8 bottles of water and exercise at least 3 times a week.  Will continue to monitor weight gain.   -Urine dip=negative, will await culture  - Urinalysis (Urine Dip) - Urine Culture   Term labor symptoms and general obstetric precautions including but not limited to vaginal bleeding, contractions, leaking of fluid and fetal movement were reviewed in detail with the patient. Please refer to After Visit Summary for other counseling recommendations.   Due to a language barrier an interpreter [phone]) was used for the provider portion of the visit.     Return in about 4 weeks (around 06/17/2022) for Routine prenatal care visit.  Future Appointments  Date Time Provider Department Center  06/17/2022  9:00 AM [facility] PROVIDER [facility] None    Glenna Fellows, FNP

## 2022-05-20 NOTE — Progress Notes (Signed)
Patient here for MH RV at [redacted]w[redacted]d.   Patient states she picked up Rx for Amoxicillin at her pharmacy on 04/25/22 and took full course as prescribed. Patient sent to lab today for Urine TOC.   Patient states she is aware of [facility] [place] on 05/23/22 at 1015.   BH consent signed today and information sheet from [facility] services given to patient.   Earlyne Iba, RN

## 2022-05-23 DIAGNOSIS — Z3482 Encounter for supervision of other normal pregnancy, second trimester: Secondary | ICD-10-CM | POA: Diagnosis not present

## 2022-05-24 LAB — URINE CULTURE

## 2022-05-26 ENCOUNTER — Other Ambulatory Visit: Payer: Self-pay | Admitting: Nurse Practitioner

## 2022-05-26 ENCOUNTER — Encounter: Payer: Self-pay | Admitting: Nurse Practitioner

## 2022-05-26 DIAGNOSIS — B951 Streptococcus, group B, as the cause of diseases classified elsewhere: Secondary | ICD-10-CM

## 2022-05-26 HISTORY — DX: Streptococcus, group b, as the cause of diseases classified elsewhere: B95.1

## 2022-05-26 MED ORDER — AMOXICILLIN 250 MG PO CAPS: 250.0000 mg | ORAL_CAPSULE | Freq: Three times a day (TID) | ORAL | 0 refills | Status: DC

## 2022-05-26 MED ORDER — AMOXICILLIN 250 MG PO CAPS: 250.0000 mg | ORAL_CAPSULE | Freq: Three times a day (TID) | ORAL | 0 refills | Status: AC

## 2022-05-26 NOTE — Progress Notes (Signed)
Telephone call to patient regarding positive urine culture for GBS.  This is the patient's second UTI.  Prescription submitted to pharmacy for Amoxicillin. Glenna Fellows, FNP   1. Group B streptococcus UTI affecting pregnancy in second trimester, antepartum #2 05/20/22  - amoxicillin (AMOXIL) 250 MG capsule; Take 1 capsule (250 mg total) by mouth 3 (three) times daily for 7 days.  Dispense: 21 capsule; Refill: 0

## 2022-05-27 ENCOUNTER — Encounter: Payer: Self-pay | Admitting: Advanced Practice Midwife

## 2022-06-17 ENCOUNTER — Ambulatory Visit: Payer: Medicaid Other | Admitting: Advanced Practice Midwife

## 2022-06-17 VITALS — Wt 170.1 lb

## 2022-06-17 DIAGNOSIS — O2340 Unspecified infection of urinary tract in pregnancy, unspecified trimester: Secondary | ICD-10-CM

## 2022-06-17 DIAGNOSIS — Z3482 Encounter for supervision of other normal pregnancy, second trimester: Secondary | ICD-10-CM

## 2022-06-17 DIAGNOSIS — O0930 Supervision of pregnancy with insufficient antenatal care, unspecified trimester: Secondary | ICD-10-CM

## 2022-06-17 NOTE — Progress Notes (Addendum)
[facility] Spanish Interpretor to translate during visit. Patient instructions given for urine collection. Confirmed patient completed antibiotics. Shiela Mayer RN

## 2022-06-17 NOTE — Progress Notes (Signed)
[facility]  PRENATAL VISIT NOTE  Subjective:  Bridget Cline is a 31 y.o. G3P2002 at [redacted]w[redacted]d being seen today for ongoing prenatal care.  She is currently monitored for the following issues for this low-risk pregnancy and has Sepsis (HCC); Late prenatal care 15 6/7; Prenatal care, subsequent pregnancy, second trimester; Short stature 5'0"; UTI (urinary tract infection) during pregnancy 04/22/22 10-25,000 Grp B stre[; and Group B streptococcus UTI affecting pregnancy in second trimester, antepartum #2 05/20/22 on their problem list.  Patient reports no complaints.  Contractions: Not present. Vag. Bleeding: None.  Movement: Present. Denies leaking of fluid/ROM.   The following portions of the patient's history were reviewed and updated as appropriate: allergies, current medications, past family history, past medical history, past social history, past surgical history and problem list. Problem list updated.  Objective:   Vitals:   06/17/22 0940  Weight: 170 lb 2 oz (77.2 kg)    Fetal Status: Fetal Heart Rate (bpm): 124 Fundal Height: 24 cm Movement: Present     General:  Alert, oriented and cooperative. Patient is in no acute distress.  Skin: Skin is warm and dry. No rash noted.   Cardiovascular: Normal heart rate noted  Respiratory: Normal respiratory effort, no problems with respiration noted  Abdomen: Soft, gravid, appropriate for gestational age.  Pain/Pressure: Absent     Pelvic: Cervical exam deferred        Extremities: Normal range of motion.  Edema: None  Mental Status: Normal mood and affect. Normal behavior. Normal judgment and thought content.   Assessment and Plan:  Pregnancy: G3P2002 at [redacted]w[redacted]d  1. Prenatal care, subsequent pregnancy, second trimester Reviewed 05/23/22 u/s at [facility] at 20 4/7 with anterior placenta, normal anatomy 20 lb 2 oz (9.129 kg) 6 lb wt gain in past 4 wks Walking 6x/wk x 40 min Not working - Urine  Culture  2. Late prenatal care 15 6/7   3. Urinary tract infection in mother during pregnancy, antepartum 04/22/22 GBS treated with Amoxicillin TID x 7 days  TOC C&S on 05/20/22= 50-100,000 Lactobacillus; treated with Amoxicillin 250 mg TID x 7 days on 05/26/22 TOC C&S today   Preterm labor symptoms and general obstetric precautions including but not limited to vaginal bleeding, contractions, leaking of fluid and fetal movement were reviewed in detail with the patient. Please refer to After Visit Summary for other counseling recommendations.  Return in about 4 weeks (around 07/15/2022) for 28 week labs, routine PNC.  Future Appointments  Date Time Provider Department Center  07/15/2022  8:40 AM [facility] PROVIDER [facility] None    Alberteen Spindle, CNM

## 2022-06-19 LAB — URINE CULTURE

## 2022-06-22 ENCOUNTER — Telehealth: Payer: Self-pay | Admitting: Family Medicine

## 2022-06-22 NOTE — Telephone Encounter (Signed)
Pt wants to know the results of her ultrasound. Please call her with an interpreter (Spanish). She stated that it has been more than a month and she asked the last time she was seen here. Thanks

## 2022-06-23 ENCOUNTER — Telehealth: Payer: Self-pay | Admitting: Physician Assistant

## 2022-06-23 NOTE — Telephone Encounter (Signed)
Note to provider, A. Streilein, PA who has called patient today.Burt Knack, RN

## 2022-06-23 NOTE — Telephone Encounter (Signed)
Call to patient, left message for her to call back.

## 2022-06-23 NOTE — Telephone Encounter (Signed)
In f/u to her inquiry, called pt to discuss [place] results, urine culture, M21/AFP (none require follow-up) and enc her to keep next appt as sched.

## 2022-07-15 ENCOUNTER — Ambulatory Visit: Payer: Medicaid Other | Admitting: Nurse Practitioner

## 2022-07-15 ENCOUNTER — Encounter: Payer: Self-pay | Admitting: Nurse Practitioner

## 2022-07-15 VITALS — BP 86/51 | HR 83 | Temp 97.0°F | Wt 178.6 lb

## 2022-07-15 DIAGNOSIS — Z3482 Encounter for supervision of other normal pregnancy, second trimester: Secondary | ICD-10-CM | POA: Diagnosis not present

## 2022-07-15 DIAGNOSIS — Z23 Encounter for immunization: Secondary | ICD-10-CM | POA: Diagnosis not present

## 2022-07-15 DIAGNOSIS — Z3483 Encounter for supervision of other normal pregnancy, third trimester: Secondary | ICD-10-CM

## 2022-07-15 DIAGNOSIS — R7309 Other abnormal glucose: Secondary | ICD-10-CM | POA: Insufficient documentation

## 2022-07-15 LAB — HEMOGLOBIN, FINGERSTICK: Hemoglobin: 12.1 g/dL (ref 11.1–15.9)

## 2022-07-15 MED ORDER — PRENATAL MULTIVITAMIN CH: 1.0000 | ORAL_TABLET | Freq: Every day | ORAL | 0 refills | Status: DC

## 2022-07-15 NOTE — Progress Notes (Signed)
[facility]  PRENATAL VISIT NOTE  Subjective:  Bridget Cline is a 31 y.o. G3P2002 at [redacted]w[redacted]d being seen today for ongoing prenatal care.  She is currently monitored for the following issues for this low-risk pregnancy and has Sepsis (HCC); Late prenatal care 15 6/7; Prenatal care, subsequent pregnancy, second trimester; Short stature 5'0"; UTI (urinary tract infection) during pregnancy 04/22/22 10-25,000 Grp B stre[; and Group B streptococcus UTI affecting pregnancy in second trimester, antepartum #2 05/20/22 on their problem list.  Patient reports no complaints.  Contractions: Not present. Vag. Bleeding: None.  Movement: Present. Denies leaking of fluid/ROM.   The following portions of the patient's history were reviewed and updated as appropriate: allergies, current medications, past family history, past medical history, past social history, past surgical history and problem list. Problem list updated.  Objective:   Vitals:   07/15/22 0839  BP: (!) 86/51  Pulse: 83  Temp: (!) 97 F (36.1 C)  Weight: 178 lb 9.6 oz (81 kg)    Fetal Status: Fetal Heart Rate (bpm): 145 Fundal Height: 28 cm Movement: Present     General:  Alert, oriented and cooperative. Patient is in no acute distress.  Skin: Skin is warm and dry. No rash noted.   Cardiovascular: Normal heart rate noted  Respiratory: Normal respiratory effort, no problems with respiration noted  Abdomen: Soft, gravid, appropriate for gestational age.  Pain/Pressure: Absent     Pelvic: Cervical exam deferred        Extremities: Normal range of motion.  Edema: None  Mental Status: Normal mood and affect. Normal behavior. Normal judgment and thought content.   Assessment and Plan:  Pregnancy: G3P2002 at [redacted]w[redacted]d  1. Prenatal care, subsequent pregnancy, second trimester -31 year old female in clinic today for prenatal care. -ROS reviewed, no complaints. -Patient reports taking PNV  daily. -28 week labs due today. -Hgb= 12.1 -Tdap given today. -Discussed 8lb weight gain since last visit.  Patient admits to eating more carbs.  Advised patient to limit carbs, sugar, and fatty foods.  Advised to increase physical activity and increase water intake. Will continue to monitor weight gain.   -28 lb 9.6 oz (13 kg)   - Hemoglobin, fingerstick - Glucose, 1 hour gestational - HIV-1/HIV-2 Qualitative RNA - RPR - Tdap vaccine greater than or equal to 7yo IM - Prenatal Vit-Fe Fumarate-FA (PRENATAL MULTIVITAMIN) TABS tablet; Take 1 tablet by mouth daily at 12 noon.  Dispense: 100 tablet; Refill: 0   Term labor symptoms and general obstetric precautions including but not limited to vaginal bleeding, contractions, leaking of fluid and fetal movement were reviewed in detail with the patient. Please refer to After Visit Summary for other counseling recommendations.   Due to a language barrier an interpreter Salli Real) was used for the provider portion of the visit.     Return in about 2 weeks (around 07/29/2022) for Routine prenatal care visit.  Future Appointments  Date Time Provider Department Center  07/29/2022  8:20 AM [facility] PROVIDER [facility] None    Glenna Fellows, FNP

## 2022-07-15 NOTE — Progress Notes (Signed)
Hgb reviewed - no treatment indicated.   Madailein Londo Ramos, RN  

## 2022-07-16 LAB — HIV-1/HIV-2 QUALITATIVE RNA
HIV-1 RNA, Qualitative: NONREACTIVE
HIV-2 RNA, Qualitative: NONREACTIVE

## 2022-07-16 LAB — RPR: RPR Ser Ql: NONREACTIVE

## 2022-07-16 LAB — GLUCOSE, 1 HOUR GESTATIONAL: Gestational Diabetes Screen: 138 mg/dL (ref 70–139)

## 2022-07-18 ENCOUNTER — Telehealth: Payer: Self-pay

## 2022-07-18 NOTE — Telephone Encounter (Signed)
Telephone call to patient regarding her abnormal 1 HR GTT=138 and the need for a 3 HR GTT.  Left message for her to call the maternity nurses at [phone] to schedule a repeat lab appointment.  Language Line used today.  Verdis Frederickson / [phone].  Dahlia Bailiff, RN

## 2022-07-19 NOTE — Telephone Encounter (Signed)
Telephone call to patient regarding her abnormal 1 HR GTT and the need for a 3 HR GTT. Patient aware to be NPO after midnight the night before the test and that she will be at the clinic most of the morning and will be leaving near lunch time and can get something to eat at that time.  3 HR GTT scheduled for 07-25-22 at 8:50 am (patient to come on in at 8 am for arrival time.  [place] staff aware.  Language Line used today.  Haynes Kerns / [phone].  Dahlia Bailiff, RN

## 2022-07-25 ENCOUNTER — Other Ambulatory Visit: Payer: Medicaid Other

## 2022-07-25 DIAGNOSIS — R7309 Other abnormal glucose: Secondary | ICD-10-CM

## 2022-07-25 NOTE — Progress Notes (Signed)
In nurse clinic for 3 hr GTT. Dot Lanes, interpreter via phone.  Pt reports npo since 6 pm last night. Instructions reviewed for test today. Questions answered and reports understanding.  Next [facility] appt 07/29/2022, pt aware. Josie Saunders, RN

## 2022-07-26 LAB — GESTATIONAL GLUCOSE TOLERANCE
Glucose, Fasting: 72 mg/dL (ref 70–94)
Glucose, GTT - 1 Hour: 129 mg/dL (ref 70–179)
Glucose, GTT - 2 Hour: 103 mg/dL (ref 70–154)
Glucose, GTT - 3 Hour: 95 mg/dL (ref 70–139)

## 2022-07-29 ENCOUNTER — Ambulatory Visit: Payer: Medicaid Other | Admitting: Advanced Practice Midwife

## 2022-07-29 VITALS — BP 106/54 | HR 91 | Temp 97.4°F | Wt 179.0 lb

## 2022-07-29 DIAGNOSIS — O0930 Supervision of pregnancy with insufficient antenatal care, unspecified trimester: Secondary | ICD-10-CM

## 2022-07-29 DIAGNOSIS — O0933 Supervision of pregnancy with insufficient antenatal care, third trimester: Secondary | ICD-10-CM

## 2022-07-29 DIAGNOSIS — O2340 Unspecified infection of urinary tract in pregnancy, unspecified trimester: Secondary | ICD-10-CM

## 2022-07-29 DIAGNOSIS — R6252 Short stature (child): Secondary | ICD-10-CM

## 2022-07-29 DIAGNOSIS — O2343 Unspecified infection of urinary tract in pregnancy, third trimester: Secondary | ICD-10-CM

## 2022-07-29 DIAGNOSIS — Z3483 Encounter for supervision of other normal pregnancy, third trimester: Secondary | ICD-10-CM

## 2022-07-29 DIAGNOSIS — Z3482 Encounter for supervision of other normal pregnancy, second trimester: Secondary | ICD-10-CM

## 2022-07-29 DIAGNOSIS — R7309 Other abnormal glucose: Secondary | ICD-10-CM

## 2022-07-29 NOTE — Progress Notes (Signed)
[place] Department [facility] Clinic  PRENATAL VISIT NOTE  Subjective:  Bridget Cline is a 31 y.o. G3P2002 at [redacted]w[redacted]d being seen today for ongoing prenatal care.  She is currently monitored for the following issues for this low-risk pregnancy and has Sepsis ([place]); Late prenatal care 15 6/7; Prenatal care, subsequent pregnancy, second trimester; Short stature 5'0"; UTI (urinary tract infection) during pregnancy 04/22/22 10-25,000 Grp B stre[; Group B streptococcus UTI affecting pregnancy in second trimester, antepartum #2 05/20/22; and Abnormal glucose on their problem list.  Patient reports no complaints.  Contractions: Not present. Vag. Bleeding: None.  Movement: Present. Denies leaking of fluid/ROM.   The following portions of the patient's history were reviewed and updated as appropriate: allergies, current medications, past family history, past medical history, past social history, past surgical history and problem list. Problem list updated.  Objective:   Vitals:   07/29/22 0820  BP: (!) 106/54  Pulse: 91  Temp: (!) 97.4 F (36.3 C)  Weight: 179 lb (81.2 kg)    Fetal Status: Fetal Heart Rate (bpm): 130 Fundal Height: 32 cm Movement: Present     General:  Alert, oriented and cooperative. Patient is in no acute distress.  Skin: Skin is warm and dry. No rash noted.   Cardiovascular: Normal heart rate noted  Respiratory: Normal respiratory effort, no problems with respiration noted  Abdomen: Soft, gravid, appropriate for gestational age.  Pain/Pressure: Absent     Pelvic: Cervical exam deferred        Extremities: Normal range of motion.  Edema: None  Mental Status: Normal mood and affect. Normal behavior. Normal judgment and thought content.   Assessment and Plan:  Pregnancy: G3P2002 at 108w6d  1. Urinary tract infection in mother during pregnancy, antepartum UTI dx'd 05/26/22 C&S TOC 06/17/22=neg  2. Short stature 5'0"   3. Late prenatal care 15  6/7   4. Prenatal care, subsequent pregnancy, second trimester 29 lb (13.2 kg) Not working Walking 5x/wk x 1 hour Doesn't have car seat yet  5. Abnormal glucose 3 hour GTT 07/25/22=wnl   Preterm labor symptoms and general obstetric precautions including but not limited to vaginal bleeding, contractions, leaking of fluid and fetal movement were reviewed in detail with the patient. Please refer to After Visit Summary for other counseling recommendations.  Return in about 2 weeks (around 08/12/2022) for routine PNC.  No future appointments.  Herbie Saxon, CNM

## 2022-08-03 NOTE — Addendum Note (Signed)
Addended by: Cletis Media on: 08/03/2022 12:32 PM   Modules accepted: Orders

## 2022-08-12 ENCOUNTER — Ambulatory Visit: Payer: Medicaid Other | Admitting: Advanced Practice Midwife

## 2022-08-12 VITALS — BP 106/57 | Wt 183.0 lb

## 2022-08-12 DIAGNOSIS — Z3482 Encounter for supervision of other normal pregnancy, second trimester: Secondary | ICD-10-CM

## 2022-08-12 DIAGNOSIS — O0930 Supervision of pregnancy with insufficient antenatal care, unspecified trimester: Secondary | ICD-10-CM

## 2022-08-12 DIAGNOSIS — R7309 Other abnormal glucose: Secondary | ICD-10-CM

## 2022-08-12 MED ORDER — PRENATAL VITAMINS 28-0.8 MG PO TABS: 1.0000 | ORAL_TABLET | Freq: Every day | ORAL | 0 refills | Status: DC

## 2022-08-12 NOTE — Progress Notes (Signed)
[employer] utilized [phone]. Flu Vaccine VIS given. BThiele RN

## 2022-08-12 NOTE — Progress Notes (Signed)
[place] Department [facility] Clinic  PRENATAL VISIT NOTE  Subjective:  Bridget Cline is a 31 y.o. G3P2002 at [redacted]w[redacted]d being seen today for ongoing prenatal care.  She is currently monitored for the following issues for this low-risk pregnancy and has Sepsis ([place]); Late prenatal care 15 6/7; Prenatal care, subsequent pregnancy, second trimester; Short stature 5'0"; UTI (urinary tract infection) during pregnancy 04/22/22 10-25,000 Grp B stre[; Group B streptococcus UTI affecting pregnancy in second trimester, antepartum #2 05/20/22; and Abnormal glucose on their problem list.  Patient reports no complaints.  Contractions: Not present. Vag. Bleeding: None.  Movement: Present. Denies leaking of fluid/ROM.   The following portions of the patient's history were reviewed and updated as appropriate: allergies, current medications, past family history, past medical history, past social history, past surgical history and problem list. Problem list updated.  Objective:   Vitals:   08/12/22 0856  BP: (!) 106/57  Weight: 183 lb (83 kg)    Fetal Status: Fetal Heart Rate (bpm): 140 Fundal Height: 33 cm Movement: Present     General:  Alert, oriented and cooperative. Patient is in no acute distress.  Skin: Skin is warm and dry. No rash noted.   Cardiovascular: Normal heart rate noted  Respiratory: Normal respiratory effort, no problems with respiration noted  Abdomen: Soft, gravid, appropriate for gestational age.  Pain/Pressure: Absent     Pelvic: Cervical exam deferred        Extremities: Normal range of motion.  Edema: None  Mental Status: Normal mood and affect. Normal behavior. Normal judgment and thought content.   Assessment and Plan:  Pregnancy: G3P2002 at [redacted]w[redacted]d  1. Prenatal care, subsequent pregnancy, second trimester 33 lb (15 kg) Gained 4 lbs in last 2 wks Walking 5x/wk x 30 min Does not have car seat yet Not working  2. Late prenatal care 15 6/7   3.  Abnormal glucose 3 hour GTT 07/25/22=wnl   Preterm labor symptoms and general obstetric precautions including but not limited to vaginal bleeding, contractions, leaking of fluid and fetal movement were reviewed in detail with the patient. Please refer to After Visit Summary for other counseling recommendations.  No follow-ups on file.  No future appointments.  Herbie Saxon, CNM

## 2022-08-12 NOTE — Progress Notes (Signed)
The patient was dispensed  Prenatal Vitamins today. I provided counseling today regarding the medication. We discussed the medication, the side effects and when to call clinic. Patient given the opportunity to ask questions. Questions answered. BThiele RN

## 2022-08-26 ENCOUNTER — Ambulatory Visit: Payer: Medicaid Other | Admitting: Nurse Practitioner

## 2022-08-26 ENCOUNTER — Encounter: Payer: Self-pay | Admitting: Nurse Practitioner

## 2022-08-26 VITALS — BP 96/65 | HR 94 | Temp 97.7°F | Wt 186.2 lb

## 2022-08-26 DIAGNOSIS — Z3483 Encounter for supervision of other normal pregnancy, third trimester: Secondary | ICD-10-CM

## 2022-08-26 DIAGNOSIS — Z3482 Encounter for supervision of other normal pregnancy, second trimester: Secondary | ICD-10-CM

## 2022-08-26 MED ORDER — PRENATAL VITAMIN 27-0.8 MG PO TABS: 1.0000 | ORAL_TABLET | Freq: Every day | ORAL | 0 refills | Status: AC

## 2022-08-26 NOTE — Progress Notes (Signed)
Patient here for MH RV at 33 6/7. Needs more PNV today. Given.Jenetta Downer, RN

## 2022-08-26 NOTE — Progress Notes (Signed)
[place] Department [facility]  PRENATAL VISIT NOTE  Subjective:  Bridget Cline is a 31 y.o. G3P2002 at [redacted]w[redacted]d being seen today for ongoing prenatal care.  She is currently monitored for the following issues for this low-risk pregnancy and has Sepsis ([place]); Late prenatal care 15 6/7; Prenatal care, subsequent pregnancy, second trimester; Short stature 5'0"; UTI (urinary tract infection) during pregnancy 04/22/22 10-25,000 Grp B stre[; Group B streptococcus UTI affecting pregnancy in second trimester, antepartum #2 05/20/22; and Abnormal glucose on their problem list.  Patient reports no complaints.  Contractions: Not present. Vag. Bleeding: None.  Movement: Present. Denies leaking of fluid/ROM.   The following portions of the patient's history were reviewed and updated as appropriate: allergies, current medications, past family history, past medical history, past social history, past surgical history and problem list. Problem list updated.  Objective:   Vitals:   08/26/22 0853  BP: 96/65  Pulse: 94  Temp: 97.7 F (36.5 C)  Weight: 186 lb 3.2 oz (84.5 kg)    Fetal Status: Fetal Heart Rate (bpm): 140 Fundal Height: 33 cm Movement: Present     General:  Alert, oriented and cooperative. Patient is in no acute distress.  Skin: Skin is warm and dry. No rash noted.   Cardiovascular: Normal heart rate noted  Respiratory: Normal respiratory effort, no problems with respiration noted  Abdomen: Soft, gravid, appropriate for gestational age.  Pain/Pressure: Absent     Pelvic: Cervical exam deferred        Extremities: Normal range of motion.  Edema: None  Mental Status: Normal mood and affect. Normal behavior. Normal judgment and thought content.   Assessment and Plan:  Pregnancy: G3P2002 at [redacted]w[redacted]d  1. Prenatal care, subsequent pregnancy, second trimester -31 year old female in clinic today for prenatal care.  -Patient reports taking  PNV daily. -ROS reviewed,  no complaints.  Patient had general pregnancy questions, all questions answered.   -Patient states she exercises daily.   -36 lb 3.2 oz (16.4 kg)   - Prenatal Vit-Fe Fumarate-FA (PRENATAL VITAMIN) 27-0.8 MG TABS; Take 1 tablet by mouth daily.  Dispense: 100 tablet; Refill: 0   Term labor symptoms and general obstetric precautions including but not limited to vaginal bleeding, contractions, leaking of fluid and fetal movement were reviewed in detail with the patient. Please refer to After Visit Summary for other counseling recommendations.   Due to a language barrier an interpreter (language line [phone]) was used for the provider portion of the visit.     Return in about 2 weeks (around 09/09/2022) for Routine prenatal care visit.    Gregary Cromer, FNP

## 2022-09-12 ENCOUNTER — Ambulatory Visit: Payer: Medicaid Other

## 2022-09-12 ENCOUNTER — Telehealth: Payer: Self-pay

## 2022-09-12 NOTE — Telephone Encounter (Signed)
TC to patient to ask if she would like to reschedule her 09/14/22 appointment. She came for appointment today 09/12/22 and somehow her appt had been canceled. Patient is now overbooked for Wednesday afternoon, but 2 appts have opened up for 09/13/22 in the morning. Please offer patient appointment on 09/13/22 at 9:40 or 10:40. LM with number to call. [address] [place], QH#[id number].Burt Knack, RN

## 2022-09-14 ENCOUNTER — Ambulatory Visit: Payer: Medicaid Other | Admitting: Advanced Practice Midwife

## 2022-09-14 VITALS — BP 114/68 | HR 92 | Temp 98.2°F | Wt 189.2 lb

## 2022-09-14 DIAGNOSIS — Z3482 Encounter for supervision of other normal pregnancy, second trimester: Secondary | ICD-10-CM | POA: Diagnosis not present

## 2022-09-14 DIAGNOSIS — O2342 Unspecified infection of urinary tract in pregnancy, second trimester: Secondary | ICD-10-CM

## 2022-09-14 DIAGNOSIS — B951 Streptococcus, group B, as the cause of diseases classified elsewhere: Secondary | ICD-10-CM

## 2022-09-14 DIAGNOSIS — R7309 Other abnormal glucose: Secondary | ICD-10-CM

## 2022-09-14 DIAGNOSIS — O0930 Supervision of pregnancy with insufficient antenatal care, unspecified trimester: Secondary | ICD-10-CM

## 2022-09-14 NOTE — Progress Notes (Signed)
[facility]  PRENATAL VISIT NOTE  Subjective:  Bridget Cline is a 31 y.o. G3P2002 at [redacted]w[redacted]d being seen today for ongoing prenatal care.  She is currently monitored for the following issues for this low-risk pregnancy and has Sepsis (HCC); Late prenatal care 15 6/7; Prenatal care, subsequent pregnancy, second trimester; Short stature 5'0"; UTI (urinary tract infection) during pregnancy 04/22/22 10-25,000 Grp B stre[; Group B streptococcus UTI affecting pregnancy in second trimester, antepartum #2 05/20/22; and Abnormal glucose on their problem list.  Patient reports no complaints.  Contractions: Not present. Vag. Bleeding: None.  Movement: Present. Denies leaking of fluid/ROM.   The following portions of the patient's history were reviewed and updated as appropriate: allergies, current medications, past family history, past medical history, past social history, past surgical history and problem list. Problem list updated.  Objective:   Vitals:   09/14/22 1311  BP: 114/68  Pulse: 92  Temp: 98.2 F (36.8 C)  Weight: 189 lb 3.2 oz (85.8 kg)    Fetal Status: Fetal Heart Rate (bpm): 148 Fundal Height: 37 cm Movement: Present  Presentation: Vertex  General:  Alert, oriented and cooperative. Patient is in no acute distress.  Skin: Skin is warm and dry. No rash noted.   Cardiovascular: Normal heart rate noted  Respiratory: Normal respiratory effort, no problems with respiration noted  Abdomen: Soft, gravid, appropriate for gestational age.  Pain/Pressure: Absent     Pelvic: Cervical exam deferred        Extremities: Normal range of motion.  Edema: None  Mental Status: Normal mood and affect. Normal behavior. Normal judgment and thought content.   Assessment and Plan:  Pregnancy: G3P2002 at [redacted]w[redacted]d  1. Prenatal care, subsequent pregnancy, second trimester Knows when to go to L&D Has car seat and crib Not working Walking 4x/wk x 25-30 min 39  lb 3.2 oz (17.8 kg)  - Chlamydia/GC NAA, Confirmation  2. Late prenatal care 15 6/7   3. Abnormal glucose 3 hour GTT=wnl on 07/25/22  4. Group B streptococcus UTI affecting pregnancy in second trimester, antepartum #2 05/20/22 No need to collect GBS today   Preterm labor symptoms and general obstetric precautions including but not limited to vaginal bleeding, contractions, leaking of fluid and fetal movement were reviewed in detail with the patient. Please refer to After Visit Summary for other counseling recommendations.  Return in about 1 week (around 09/21/2022) for routine PNC.  Future Appointments  Date Time Provider Department Center  09/21/2022  2:40 PM AC-MH PROVIDER AC-MAT None  09/30/2022  3:30 PM AC-MH PROVIDER AC-MAT None  10/07/2022  2:40 PM AC-MH PROVIDER AC-MAT None  10/14/2022  3:20 PM AC-MH PROVIDER AC-MAT None    Alberteen Spindle, CNM

## 2022-09-17 LAB — CHLAMYDIA/GC NAA, CONFIRMATION
Chlamydia trachomatis, NAA: NEGATIVE
Neisseria gonorrhoeae, NAA: NEGATIVE

## 2022-09-20 ENCOUNTER — Ambulatory Visit: Payer: Medicaid Other

## 2022-09-21 ENCOUNTER — Ambulatory Visit: Payer: Medicaid Other | Admitting: Nurse Practitioner

## 2022-09-21 ENCOUNTER — Ambulatory Visit: Payer: Medicaid Other

## 2022-09-21 ENCOUNTER — Encounter: Payer: Self-pay | Admitting: Nurse Practitioner

## 2022-09-21 VITALS — BP 111/58 | HR 97 | Temp 97.7°F | Wt 192.0 lb

## 2022-09-21 DIAGNOSIS — Z3482 Encounter for supervision of other normal pregnancy, second trimester: Secondary | ICD-10-CM

## 2022-09-21 NOTE — Progress Notes (Signed)
[facility]  PRENATAL VISIT NOTE  Subjective:  Bridget Cline is a 31 y.o. G3P2002 at [redacted]w[redacted]d being seen today for ongoing prenatal care.  She is currently monitored for the following issues for this high-risk pregnancy and has Sepsis (HCC); Late prenatal care 15 6/7; Prenatal care, subsequent pregnancy, second trimester; Short stature 5'0"; UTI (urinary tract infection) during pregnancy 04/22/22 10-25,000 Grp B stre[; Group B streptococcus UTI affecting pregnancy in second trimester, antepartum #2 05/20/22; and Abnormal glucose on their problem list.  Patient reports no complaints.  Contractions: Irregular.  .  Movement: Present. Denies leaking of fluid/ROM.   The following portions of the patient's history were reviewed and updated as appropriate: allergies, current medications, past family history, past medical history, past social history, past surgical history and problem list. Problem list updated.  Objective:   Vitals:   09/21/22 0832  BP: (!) 111/58  Pulse: 97  Temp: 97.7 F (36.5 C)  Weight: 192 lb (87.1 kg)    Fetal Status: Fetal Heart Rate (bpm): 150 Fundal Height: 38 cm Movement: Present  Presentation: Vertex  General:  Alert, oriented and cooperative. Patient is in no acute distress.  Skin: Skin is warm and dry. No rash noted.   Cardiovascular: Normal heart rate noted  Respiratory: Normal respiratory effort, no problems with respiration noted  Abdomen: Soft, gravid, appropriate for gestational age.  Pain/Pressure: Absent     Pelvic: Cervical exam deferred        Extremities: Normal range of motion.  Edema: None  Mental Status: Normal mood and affect. Normal behavior. Normal judgment and thought content.   Assessment and Plan:  Pregnancy: G3P2002 at [redacted]w[redacted]d  1. Prenatal care, subsequent pregnancy, second trimester -31 year old female in clinic today for prenatal care. -ROS reviewed, no complaints. -Patient reports taking  PNV daily. -3lb weight gain since last visit.  Reviewed patient to continue to exercise, increase water intake, limit salt, decrease fats and sugars. -Reviewed CT/GC results with patient.  Informed test results were negative. -Reviewed with patient of when to report to hospital for delivery. -42 lb (19.1 kg)    Term labor symptoms and general obstetric precautions including but not limited to vaginal bleeding, contractions, leaking of fluid and fetal movement were reviewed in detail with the patient. Please refer to After Visit Summary for other counseling recommendations.   Due to a language barrier an interpreter Roddie Mc Yemen) was used for the provider portion of the visit.     Return in about 1 week (around 09/28/2022) for Routine prenatal care visit.  Future Appointments  Date Time Provider Department Center  09/30/2022  3:30 PM AC-MH PROVIDER AC-MAT None  10/07/2022  2:40 PM AC-MH PROVIDER AC-MAT None  10/14/2022  3:20 PM AC-MH PROVIDER AC-MAT None    Glenna Fellows, FNP

## 2022-09-30 ENCOUNTER — Ambulatory Visit: Payer: Medicaid Other | Admitting: Advanced Practice Midwife

## 2022-09-30 ENCOUNTER — Telehealth: Payer: Self-pay

## 2022-09-30 VITALS — BP 114/63 | HR 84 | Temp 97.5°F | Wt 194.2 lb

## 2022-09-30 DIAGNOSIS — Z3482 Encounter for supervision of other normal pregnancy, second trimester: Secondary | ICD-10-CM

## 2022-09-30 DIAGNOSIS — O0930 Supervision of pregnancy with insufficient antenatal care, unspecified trimester: Secondary | ICD-10-CM

## 2022-09-30 DIAGNOSIS — R6252 Short stature (child): Secondary | ICD-10-CM

## 2022-09-30 NOTE — Progress Notes (Signed)
[facility]  PRENATAL VISIT NOTE  Subjective:  Bridget Cline is a 31 y.o. G3P2002 at [redacted]w[redacted]d being seen today for ongoing prenatal care.  She is currently monitored for the following issues for this low-risk pregnancy and has Sepsis (HCC); Late prenatal care 15 6/7; Prenatal care, subsequent pregnancy, second trimester; Short stature 5'0"; UTI (urinary tract infection) during pregnancy 04/22/22 10-25,000 Grp B stre[; Group B streptococcus UTI affecting pregnancy in second trimester, antepartum #2 05/20/22; and Abnormal glucose on their problem list.  Patient reports no complaints.  Contractions: Not present. Vag. Bleeding: None.  Movement: Present. Denies leaking of fluid/ROM.   The following portions of the patient's history were reviewed and updated as appropriate: allergies, current medications, past family history, past medical history, past social history, past surgical history and problem list. Problem list updated.  Objective:   Vitals:   09/30/22 1527  BP: 114/63  Pulse: 84  Temp: (!) 97.5 F (36.4 C)  Weight: 194 lb 3.2 oz (88.1 kg)    Fetal Status: Fetal Heart Rate (bpm): 131 Fundal Height: 45 cm Movement: Present  Presentation: Vertex  General:  Alert, oriented and cooperative. Patient is in no acute distress.  Skin: Skin is warm and dry. No rash noted.   Cardiovascular: Normal heart rate noted  Respiratory: Normal respiratory effort, no problems with respiration noted  Abdomen: Soft, gravid, appropriate for gestational age.  Pain/Pressure: Absent     Pelvic: Cervical exam performed Dilation: Fingertip Effacement (%): Thick Station: Ballotable  Extremities: Normal range of motion.  Edema: None  Mental Status: Normal mood and affect. Normal behavior. Normal judgment and thought content.   Assessment and Plan:  Pregnancy: G3P2002 at [redacted]w[redacted]d  1. Prenatal care, subsequent pregnancy, second trimester U/s ordered for S>D EFW=9#  by [employer] Not working Has car seat and crib Knows when to go to L&D Walking 3x/wk x 30 min IOL paperwork completed  - US OB Limited; Future  2. Late prenatal care 15 6/7   3. Short stature 5'0"      Term labor symptoms and general obstetric precautions including but not limited to vaginal bleeding, contractions, leaking of fluid and fetal movement were reviewed in detail with the patient. Please refer to After Visit Summary for other counseling recommendations.  Return in about 1 week (around 10/07/2022) for routine PNC.  Future Appointments  Date Time Provider Department Center  10/07/2022  2:40 PM AC-MH PROVIDER AC-MAT None  10/14/2022  3:20 PM AC-MH PROVIDER AC-MAT None    Alberteen Spindle, CNM

## 2022-09-30 NOTE — Progress Notes (Signed)
[employer] utilized #[id number]. Confirmed that fax received for IOL request. BThiele RN

## 2022-09-30 NOTE — Telephone Encounter (Signed)
Left message on VM that patient has U/S appointment at [facility] 10/04/22 @ 2:30;  2903  Gustavo Lah; to arrive 15 minutes early with a full bladder. BThiele RN

## 2022-10-03 NOTE — Telephone Encounter (Signed)
Call to client with [facility] [employer] ID [phone] to notify her of [place] appt 10/04/2022 and left message to call maternity clinic ASAP regarding appt. Number to call provided. Call to emergency contact and left message requesting assistance contacting client to notify her [place] scheduled 10/04/2022 and to call [facility] - number to call provided. Jossie Ng, RN

## 2022-10-03 NOTE — Telephone Encounter (Signed)
RN noticed that per EPIC appt desk, OB US appt rescheduled for 10/05/22 at 1600 (remains at [facility]) and scheduler left her a voicemail message regarding change.  Call to client to [facility] ID # [phone] to correct previous message sent regarding [place] appt tomorrow. Left message that [place] changed to 10/05/2022 and requested she call [facility] ASAP to verify aware of appt date / time. Number to call provided. Jossie Ng, RN

## 2022-10-04 ENCOUNTER — Ambulatory Visit: Payer: MEDICAID

## 2022-10-04 ENCOUNTER — Other Ambulatory Visit: Payer: Self-pay | Admitting: Obstetrics and Gynecology

## 2022-10-04 DIAGNOSIS — Z3490 Encounter for supervision of normal pregnancy, unspecified, unspecified trimester: Secondary | ICD-10-CM

## 2022-10-04 NOTE — Telephone Encounter (Signed)
RN noticed that per EPIC appt desk, OB US appt rescheduled for 10/05/22 at 1600 (remains at [facility]). Patient answered and she states she was aware of appointment change and will be there for [place] tomorrow as well as MH RV 10/07/22 at 2:20 arrival time.   Earlyne Iba, RN

## 2022-10-04 NOTE — Telephone Encounter (Signed)
RN noticed that per EPIC appt desk, OB US appt rescheduled for 10/05/22 at 1600 (remains at [facility]) and scheduler left her a voicemail message regarding change.

## 2022-10-04 NOTE — Progress Notes (Signed)
[id number] at [redacted]w[redacted]d, LMP of 01/01/22, c/w [place] at [redacted]w[redacted]d.  Scheduled for induction of labor for for possible LGA on 10/08/22 at 0800.   Prenatal provider: [facility] Pregnancy complicated by: GBS UTI Sepsis Late to [facility] 15 6/7 Short stature Abnormal 1 hr gtt  Prenatal Labs: Blood type/Rh O  Antibody screen neg  Rubella immune    Varicella Varivax x2  RPR NR  HBsAg Negative (06/23 1115)   Hep C NR  HIV   NR  GC neg  Chlamydia neg  Genetic screening cfDNA negative  1 hour GTT 138  3 hour GTT 72,129,103,96  GBS   Pos at NOB   Tdap: 07/15/22 Flu: 08/12/22 Contraception: Condoms Feeding preference: breast and formula feeding  ____ Chari Manning, CNM Certified Nurse Midwife [facility]

## 2022-10-05 ENCOUNTER — Ambulatory Visit
Admission: RE | Admit: 2022-10-05 | Discharge: 2022-10-05 | Disposition: A | Discharge status: Home / Self Care | Payer: Medicaid Other | Source: Ambulatory Visit | Attending: Advanced Practice Midwife | Admitting: Advanced Practice Midwife

## 2022-10-05 DIAGNOSIS — O3663X0 Maternal care for excessive fetal growth, third trimester, not applicable or unspecified: Secondary | ICD-10-CM | POA: Insufficient documentation

## 2022-10-05 DIAGNOSIS — Z3482 Encounter for supervision of other normal pregnancy, second trimester: Secondary | ICD-10-CM

## 2022-10-05 DIAGNOSIS — Z3A40 40 weeks gestation of pregnancy: Secondary | ICD-10-CM | POA: Diagnosis not present

## 2022-10-05 DIAGNOSIS — O480 Post-term pregnancy: Secondary | ICD-10-CM | POA: Insufficient documentation

## 2022-10-05 DIAGNOSIS — O26843 Uterine size-date discrepancy, third trimester: Secondary | ICD-10-CM | POA: Diagnosis not present

## 2022-10-07 ENCOUNTER — Encounter: Payer: Self-pay | Admitting: Nurse Practitioner

## 2022-10-07 ENCOUNTER — Ambulatory Visit: Payer: Medicaid Other | Admitting: Nurse Practitioner

## 2022-10-07 VITALS — BP 111/60 | HR 77 | Temp 97.3°F | Wt 192.8 lb

## 2022-10-07 DIAGNOSIS — Z3483 Encounter for supervision of other normal pregnancy, third trimester: Secondary | ICD-10-CM

## 2022-10-07 NOTE — Progress Notes (Signed)
[facility] M[facility] Health Clinic  PRENATAL VISIT NOTE  Subjective:  Bridget Cline is a 31 y.o. G3P2002 at [redacted]w[redacted]d being seen today for ongoing prenatal care.  She is currently monitored for the following issues for this low-risk pregnancy and has Sepsis (HCC); Late prenatal care 15 6/7; Prenatal care, subsequent pregnancy, second trimester; Short stature 5'0"; UTI (urinary tract infection) during pregnancy 04/22/22 10-25,000 Grp B stre[; Group B streptococcus UTI affecting pregnancy in second trimester, antepartum #2 05/20/22; and Abnormal glucose on their problem list.  Patient reports  stabbing sensation in pelvic .  Contractions: Not present. Vag. Bleeding: None.  Movement: Present. Denies leaking of fluid/ROM.   The following portions of the patient's history were reviewed and updated as appropriate: allergies, current medications, past family history, past medical history, past social history, past surgical history and problem list. Problem list updated.  Objective:   Vitals:   10/07/22 1458  BP: 111/60  Pulse: 77  Temp: (!) 97.3 F (36.3 C)  Weight: 192 lb 12.8 oz (87.5 kg)    Fetal Status: Fetal Heart Rate (bpm): 160 Fundal Height: 44 cm Movement: Present  Presentation: Vertex  General:  Alert, oriented and cooperative. Patient is in no acute distress.  Skin: Skin is warm and dry. No rash noted.   Cardiovascular: Normal heart rate noted  Respiratory: Normal respiratory effort, no problems with respiration noted  Abdomen: Soft, gravid, appropriate for gestational age.  Pain/Pressure: Present     Pelvic: Cervical exam deferred        Extremities: Normal range of motion.  Edema: Trace  Mental Status: Normal mood and affect. Normal behavior. Normal judgment and thought content.   Assessment and Plan:  Pregnancy: G3P2002 at [redacted]w[redacted]d  1. Prenatal care, subsequent pregnancy in third trimester -31 year old female in clinic today for prenatal care. -ROS  reviewed, patient reports at times she has a stabbing sensation in her pelvis.  No other signs and symptoms or problems noted. -Patient reports taking PNV daily. -IOL paperwork completed at last visit.  Reviewed with patient of when to report to the hospital.   IOL scheduled for 10/08/22 at 8 am. -42 lb 12.8 oz (19.4 kg)  -10/05/22 [place] ([redacted]w[redacted]d), presentation cephalic, placenta anterior, normal amniotic fluid, EFW 92%.    Term labor symptoms and general obstetric precautions including but not limited to vaginal bleeding, contractions, leaking of fluid and fetal movement were reviewed in detail with the patient. Please refer to After Visit Summary for other counseling recommendations.   Due to a language barrier an interpreter [phone]) was used for the provider portion of the visit.     Return in about 6 weeks (around 11/18/2022) for postpartum.  Future Appointments  Date Time Provider Department Center  10/14/2022  3:20 PM AC-MH PROVIDER AC-MAT None    Glenna Fellows, FNP

## 2022-10-07 NOTE — Progress Notes (Signed)
Patient here for MH RV at 39 6/7. Patient kept 10/05/22 U/S, and IOL scheduled for tomorrow, 10/08/22 at 0800. Patient aware. Burt Knack, RN

## 2022-10-08 ENCOUNTER — Encounter: Payer: Self-pay | Admitting: Obstetrics and Gynecology

## 2022-10-08 ENCOUNTER — Other Ambulatory Visit: Payer: Self-pay

## 2022-10-08 ENCOUNTER — Inpatient Hospital Stay
Admission: EM | Admit: 2022-10-08 | Discharge: 2022-10-10 | DRG: 806 | Disposition: A | Discharge status: Home / Self Care | Payer: Medicaid Other | Attending: Obstetrics | Admitting: Obstetrics

## 2022-10-08 DIAGNOSIS — Z3483 Encounter for supervision of other normal pregnancy, third trimester: Secondary | ICD-10-CM | POA: Diagnosis not present

## 2022-10-08 DIAGNOSIS — Z3A40 40 weeks gestation of pregnancy: Secondary | ICD-10-CM | POA: Diagnosis not present

## 2022-10-08 DIAGNOSIS — Z3482 Encounter for supervision of other normal pregnancy, second trimester: Secondary | ICD-10-CM

## 2022-10-08 DIAGNOSIS — D62 Acute posthemorrhagic anemia: Secondary | ICD-10-CM | POA: Diagnosis not present

## 2022-10-08 DIAGNOSIS — O9081 Anemia of the puerperium: Secondary | ICD-10-CM | POA: Diagnosis not present

## 2022-10-08 DIAGNOSIS — R7309 Other abnormal glucose: Principal | ICD-10-CM

## 2022-10-08 DIAGNOSIS — Z3490 Encounter for supervision of normal pregnancy, unspecified, unspecified trimester: Secondary | ICD-10-CM | POA: Diagnosis present

## 2022-10-08 DIAGNOSIS — O3663X0 Maternal care for excessive fetal growth, third trimester, not applicable or unspecified: Secondary | ICD-10-CM | POA: Diagnosis not present

## 2022-10-08 DIAGNOSIS — O99824 Streptococcus B carrier state complicating childbirth: Secondary | ICD-10-CM | POA: Diagnosis present

## 2022-10-08 DIAGNOSIS — O26893 Other specified pregnancy related conditions, third trimester: Secondary | ICD-10-CM | POA: Diagnosis not present

## 2022-10-08 LAB — CBC
HCT: 37.1 % (ref 36.0–46.0)
Hemoglobin: 12.8 g/dL (ref 12.0–15.0)
MCH: 30.7 pg (ref 26.0–34.0)
MCHC: 34.5 g/dL (ref 30.0–36.0)
MCV: 89 fL (ref 80.0–100.0)
Platelets: 196 10*3/uL (ref 150–400)
RBC: 4.17 MIL/uL (ref 3.87–5.11)
RDW: 13 % (ref 11.5–15.5)
WBC: 5.5 10*3/uL (ref 4.0–10.5)
nRBC: 0 % (ref 0.0–0.2)

## 2022-10-08 LAB — TYPE AND SCREEN
ABO/RH(D): O POS
Antibody Screen: NEGATIVE

## 2022-10-08 MED ORDER — MISOPROSTOL 200 MCG PO TABS
ORAL_TABLET | ORAL | Status: AC
  Filled 2022-10-08: qty 4

## 2022-10-08 MED ORDER — LIDOCAINE HCL (PF) 1 % IJ SOLN
30.0000 mL | INTRAMUSCULAR | Status: DC | PRN
  Filled 2022-10-08: qty 30

## 2022-10-08 MED ORDER — OXYTOCIN BOLUS FROM INFUSION
333.0000 mL | Freq: Once | INTRAVENOUS | Status: AC
  Administered 2022-10-08: 333 mL via INTRAVENOUS

## 2022-10-08 MED ORDER — TERBUTALINE SULFATE 1 MG/ML IJ SOLN: 0.2500 mg | Freq: Once | INTRAMUSCULAR | Status: DC | PRN

## 2022-10-08 MED ORDER — SOD CITRATE-CITRIC ACID 500-334 MG/5ML PO SOLN: 30.0000 mL | ORAL | Status: DC | PRN

## 2022-10-08 MED ORDER — OXYTOCIN 10 UNIT/ML IJ SOLN
INTRAMUSCULAR | Status: AC
  Filled 2022-10-08: qty 2

## 2022-10-08 MED ORDER — LACTATED RINGERS IV SOLN: 500.0000 mL | INTRAVENOUS | Status: DC | PRN

## 2022-10-08 MED ORDER — MISOPROSTOL 25 MCG QUARTER TABLET
25.0000 ug | ORAL_TABLET | Freq: Once | ORAL | Status: DC
  Filled 2022-10-08: qty 1

## 2022-10-08 MED ORDER — OXYTOCIN-SODIUM CHLORIDE 30-0.9 UT/500ML-% IV SOLN
2.5000 [IU]/h | INTRAVENOUS | Status: DC
  Administered 2022-10-09: 2.5 [IU]/h via INTRAVENOUS
  Filled 2022-10-08: qty 500

## 2022-10-08 MED ORDER — AMMONIA AROMATIC IN INHA
RESPIRATORY_TRACT | Status: AC
  Filled 2022-10-08: qty 10

## 2022-10-08 MED ORDER — OXYTOCIN-SODIUM CHLORIDE 30-0.9 UT/500ML-% IV SOLN
1.0000 m[IU]/min | INTRAVENOUS | Status: DC
  Administered 2022-10-08: 2 m[IU]/min via INTRAVENOUS

## 2022-10-08 MED ORDER — ACETAMINOPHEN 325 MG PO TABS: 650.0000 mg | ORAL_TABLET | ORAL | Status: DC | PRN

## 2022-10-08 MED ORDER — MISOPROSTOL 25 MCG QUARTER TABLET
25.0000 ug | ORAL_TABLET | ORAL | Status: DC | PRN
  Administered 2022-10-08: 25 ug via VAGINAL
  Filled 2022-10-08: qty 1

## 2022-10-08 MED ORDER — ONDANSETRON HCL 4 MG/2ML IJ SOLN: 4.0000 mg | Freq: Four times a day (QID) | INTRAMUSCULAR | Status: DC | PRN

## 2022-10-08 MED ORDER — SODIUM CHLORIDE 0.9 % IV SOLN
5.0000 10*6.[IU] | Freq: Once | INTRAVENOUS | Status: AC
  Administered 2022-10-08: 5 10*6.[IU] via INTRAVENOUS
  Filled 2022-10-08: qty 5

## 2022-10-08 MED ORDER — LACTATED RINGERS IV SOLN: INTRAVENOUS | Status: DC

## 2022-10-08 MED ORDER — FENTANYL CITRATE (PF) 100 MCG/2ML IJ SOLN
50.0000 ug | INTRAMUSCULAR | Status: DC | PRN
  Administered 2022-10-08: 100 ug via INTRAVENOUS
  Filled 2022-10-08: qty 2

## 2022-10-08 MED ORDER — PENICILLIN G POT IN DEXTROSE 60000 UNIT/ML IV SOLN
3.0000 10*6.[IU] | INTRAVENOUS | Status: DC
  Administered 2022-10-08 (×3): 3 10*6.[IU] via INTRAVENOUS
  Filled 2022-10-08 (×5): qty 50

## 2022-10-08 MED ORDER — MISOPROSTOL 25 MCG QUARTER TABLET
25.0000 ug | ORAL_TABLET | ORAL | Status: DC | PRN
  Administered 2022-10-08: 25 ug via ORAL
  Filled 2022-10-08: qty 1

## 2022-10-08 NOTE — Progress Notes (Signed)
Labor Progress Note  Bridget Cline is a 31 y.o. G3P2002 at [redacted]w[redacted]d by LMP admitted for induction of labor due to Elective at term with suspected macrosomia fetus.   Subjective:  feeling pain with UCs, would like pain meds.   Objective: BP 103/61   Pulse 86   Temp 98.4 F (36.9 C) (Oral)   Resp 16   Ht 5' (1.524 m)   Wt 82.6 kg   LMP 01/01/2022 (Exact Date)   BMI 35.54 kg/m  Notable VS details: reviewed  Fetal Assessment: FHT:  FHR: 135 bpm, variability: moderate,  accelerations:  Present,  decelerations:  Absent Category/reactivity:  Category I UC:   regular, every 3- 4 minutes SVE:   5/90/-1, soft, anterior  - Bloody show noted  Membrane status: AROM Amniotic color: clear  Labs Lab Results  Component Value Date   WBC 5.5 10/08/2022   HGB 12.8 10/08/2022   HCT 37.1 10/08/2022   MCV 89.0 10/08/2022   PLT 196 10/08/2022    Assessment / Plan: Elective term IOL G3P2002 at [redacted]w[redacted]d  Suspected macrosomia  Labor:  no progress on Pitocin x4hrs, DC and given cytotec x 1 oral and vaginal at 1510; AROM and Pitocin restarted. Now actively laboring.  Preeclampsia:  no e/o Pre_E Fetal Wellbeing:  Category I Pain Control:  IV pain meds I/D:   GBS Pos, adequately tx with PCN Anticipated MOD:  NSVD  Randa Ngo, CNM 10/08/2022, 11:33 PM

## 2022-10-08 NOTE — Progress Notes (Signed)
Labor Progress Note  Bridget Cline is a 31 y.o. G3P2002 at [redacted]w[redacted]d by LMP admitted for induction of labor due to Elective at term with suspected macrosomia fetus.   Subjective:  feeling no pain with UCs  Objective: BP 115/79 (BP Location: Left Arm)   Pulse 86   Temp 97.6 F (36.4 C) (Oral)   Resp 16   Ht 5' (1.524 m)   Wt 82.6 kg   LMP 01/01/2022 (Exact Date)   BMI 35.54 kg/m  Notable VS details: reviewed  Fetal Assessment: FHT:  FHR: 135 bpm, variability: moderate,  accelerations:  Present,  decelerations:  Absent Category/reactivity:  Category I UC:   regular, every 3- 4 minutes SVE:   2/50/-3, posterior, fetal head ballotable  Membrane status: intact Amniotic color: n/a  Labs Lab Results  Component Value Date   WBC 5.5 10/08/2022   HGB 12.8 10/08/2022   HCT 37.1 10/08/2022   MCV 89.0 10/08/2022   PLT 196 10/08/2022    Assessment / Plan: Elective term IOL G3P2002 at [redacted]w[redacted]d  Suspected macrosomia  Labor:  no progress on Pitocin, DC and given cytotec x 1 oral and vaginal dose, will reassess in 4hrs.  Preeclampsia:  no e/o Pre_E Fetal Wellbeing:  Category I Pain Control:  Labor support without medications I/D:   GBS Pos, given 2 doses of PCN Anticipated MOD:  NSVD  Randa Ngo, CNM 10/08/2022, 3:43 PM

## 2022-10-08 NOTE — Progress Notes (Signed)
Labor Progress Note  Bridget Cline is a 31 y.o. G3P2002 at [redacted]w[redacted]d by LMP admitted for induction of labor due to Elective at term with suspected macrosomia fetus.   Subjective:  feeling no pain with UCs  Objective: BP (!) 115/59 (BP Location: Left Arm)   Pulse 85   Temp 98.2 F (36.8 C) (Oral)   Resp 16   Ht 5' (1.524 m)   Wt 82.6 kg   LMP 01/01/2022 (Exact Date)   BMI 35.54 kg/m  Notable VS details: reviewed  Fetal Assessment: FHT:  FHR: 135 bpm, variability: moderate,  accelerations:  Present,  decelerations:  Absent Category/reactivity:  Category I UC:   regular, every 3- 4 minutes SVE:   4/50/-2, posterior, fetal head well applied with BBOW - copious amount of clear fluid.   Membrane status: AROM Amniotic color: clear  Labs Lab Results  Component Value Date   WBC 5.5 10/08/2022   HGB 12.8 10/08/2022   HCT 37.1 10/08/2022   MCV 89.0 10/08/2022   PLT 196 10/08/2022    Assessment / Plan: Elective term IOL G3P2002 at [redacted]w[redacted]d  Suspected macrosomia  Labor:  no progress on Pitocin, DC and given cytotec x 1 oral and vaginal at 1510; now cervical change and AROM.  Preeclampsia:  no e/o Pre_E Fetal Wellbeing:  Category I Pain Control:  Labor support without medications I/D:   GBS Pos, given 3 doses of PCN Anticipated MOD:  NSVD  Prudencio Pair Juan Kissoon, CNM 10/08/2022, 7:17 PM

## 2022-10-08 NOTE — H&P (Signed)
OB History & Physical   History of Present Illness:  Chief Complaint: induction  HPI:  Bridget Cline is a 31 y.o. G57P2002 female at [redacted]w[redacted]d dated by LMP and c/w [place] at [redacted]w[redacted]d; EDD 10/08/22.  She presents to L&D for active FM; denies UCs, LOF or VB. Having some pelvic pressure.    Pregnancy Issues: 1. GBS Pos UTI x 2 this preg 2. S>D, Growth on 12/6: 4200gm, 93%ile   Maternal Medical History:   Past Medical History:  Diagnosis Date   Medical history non-contributory     Past Surgical History:  Procedure Laterality Date   NO PAST SURGERIES      No Known Allergies  Prior to Admission medications   Medication Sig Start Date End Date Taking? Authorizing Provider  Prenatal Vit-Fe Fumarate-FA (PRENATAL VITAMIN) 27-0.8 MG TABS Take 1 tablet by mouth daily. 08/26/22  Yes Federico Flake, MD     Prenatal care site: [facility]   Social History: She  reports that she has never smoked. She has never been exposed to tobacco smoke. She has never used smokeless tobacco. She reports that she does not drink alcohol and does not use drugs.  Family History: family history includes Healthy in her brother, daughter, daughter, sister, sister, sister, sister, and sister; Hyperlipidemia in her father and paternal uncle; Hypertension in her father and paternal uncle; Prostatitis in her father; Pulmonary disease in her maternal aunt; Ulcers in her paternal grandmother.   Review of Systems: A full review of systems was performed and negative except as noted in the HPI.     Physical Exam:  Vital Signs: BP 115/67   Pulse 86   Temp 98.2 F (36.8 C) (Oral)   Resp 18   Ht 5' (1.524 m)   Wt 82.6 kg   LMP 01/01/2022 (Exact Date)   BMI 35.54 kg/m   General: no acute distress.  HEENT: normocephalic, atraumatic Heart: regular rate & rhythm.  No murmurs/rubs/gallops Lungs: clear to auscultation bilaterally, normal respiratory effort Abdomen: soft, gravid, non-tender;  EFW:  9lbs Pelvic:   External: Normal external female genitalia  Cervix: Dilation: 2.5 / Effacement (%): 50 / Station: -2    Extremities: non-tender, symmetric, no edema bilaterally.  DTRs: 2+  Neurologic: Alert & oriented x 3.    Results for orders placed or performed during the hospital encounter of 10/08/22 (from the past 24 hour(s))  CBC     Status: None   Collection Time: 10/08/22  9:05 AM  Result Value Ref Range   WBC 5.5 4.0 - 10.5 K/uL   RBC 4.17 3.87 - 5.11 MIL/uL   Hemoglobin 12.8 12.0 - 15.0 g/dL   HCT 07.8 67.5 - 44.9 %   MCV 89.0 80.0 - 100.0 fL   MCH 30.7 26.0 - 34.0 pg   MCHC 34.5 30.0 - 36.0 g/dL   RDW 20.1 00.7 - 12.1 %   Platelets 196 150 - 400 K/uL   nRBC 0.0 0.0 - 0.2 %  Type and screen     Status: None   Collection Time: 10/08/22  9:05 AM  Result Value Ref Range   ABO/RH(D) O POS    Antibody Screen NEG    Sample Expiration      10/11/2022,2359 Performed at [facility], [address] [place], [place] [address]     Pertinent Results:  Prenatal Labs: Blood type/Rh O POS  Antibody screen neg  Rubella Immune  Varicella Immune  RPR NR  HBsAg Neg  HIV NR  GC neg  Chlamydia neg  Genetic screening negative  1 hour GTT  138; A1C 5.6  3 hour GTT  72-129-103-95  GBS POS urine  05/25/22: Anatomy US at [facility]: anterior placenta, 20.4wks, Nl anatomy  FHT: 140bpm, mod var, + accels, no decels TOCO: 2-39min SVE:  Dilation: 2.5 / Effacement (%): 50 / Station: -2    Cephalic by leopolds  US OB Comp + 14 Wk  Result Date: 10/05/2022 CLINICAL DATA:  Size greater than dates EXAM: OBSTETRICAL ULTRASOUND >14 WKS FINDINGS: Number of Fetuses: 1 Heart Rate:  140 bpm Movement: Yes Presentation: Cephalic Previa: No Placental Location: Anterior Amniotic Fluid (Subjective): Normal Amniotic Fluid (Objective): AFI = 12.4 cm (5%ile= 7.2 cm, 95%= 22.6 cm for 39 wks) FETAL BIOMETRY BPD: 9.6cm 39w 2d HC:   36.1cm out of range of the reference tables AC:   37.4cm 41w 3d FL:    7.6cm 39w 0d Current Mean GA: 40w 1d [place] EDC: 10/04/2022 Assigned GA:  39w 4d Assigned EDC: 10/08/2022 Estimated Fetal Weight:  4197.8g 92.8%ile FETAL ANATOMY Lateral Ventricles: Not visualized Thalami/CSP: Not visualized Posterior Fossa:  Not visualized Nuchal Region: Not visualized   NFT= N/A > 20 WKS Upper Lip: Appears normal Spine: Not visualized 4 Chamber Heart on Left: Appears normal LVOT: Not visualized RVOT: Not visualized Stomach on Left: Appears normal 3 Vessel Cord: Not visualized Cord Insertion site: Appears normal Kidneys: Appears normal Bladder: Appears normal Extremities: Not visualized Sex: Female Technically difficult due to: Late gestational age and fetal position Maternal Findings: Cervix:  5.1 cm IMPRESSION: 1. Single live intrauterine pregnancy as above, estimated age 78 weeks and 1 day. 2. Limited fetal anatomic survey due to late gestational age. Intracranial structures, spine, extremities, umbilical cord, and ventricular outflow tracks are not adequately visualized. Electronically Signed   By: Sharlet Salina M.D.   On: 10/05/2022 17:11    Assessment:  Bridget Cline is a 31 y.o. G46P2002 female at [redacted]w[redacted]d with IOL.   Plan:  1. Admit to Labor & Delivery; consents reviewed and obtained - reviewed admission with BEB  2. Fetal Well being  - Fetal Tracing: Cat I tracing - Group B Streptococcus ppx indicated: POS - Presentation: cephalic confirmed by SVE   3. Routine OB: - Prenatal labs reviewed, as above - Rh O Pos - CBC, T&S, RPR on admit - Clear fluids, IVF  4. Induction of Labor -  Contractions: external toco in place -  Pelvis proven to 3700gm -  Plan for induction with pitocin and AROM -  Plan for continuous fetal monitoring  -  Maternal pain control as desired - Anticipate vaginal delivery  5. Post Partum Planning: - Infant feeding: breast and formula - Contraception: condoms - Tdap 07/15/22 - Flu 08/12/22  Prudencio Pair Conchetta Lamia, CNM 10/08/22 11:38 AM

## 2022-10-09 ENCOUNTER — Encounter: Payer: Self-pay | Admitting: Obstetrics and Gynecology

## 2022-10-09 DIAGNOSIS — Z3490 Encounter for supervision of normal pregnancy, unspecified, unspecified trimester: Secondary | ICD-10-CM | POA: Diagnosis not present

## 2022-10-09 DIAGNOSIS — Z3483 Encounter for supervision of other normal pregnancy, third trimester: Secondary | ICD-10-CM | POA: Diagnosis not present

## 2022-10-09 DIAGNOSIS — O99824 Streptococcus B carrier state complicating childbirth: Secondary | ICD-10-CM | POA: Diagnosis not present

## 2022-10-09 DIAGNOSIS — Z3A40 40 weeks gestation of pregnancy: Secondary | ICD-10-CM | POA: Diagnosis not present

## 2022-10-09 LAB — RPR: RPR Ser Ql: NONREACTIVE

## 2022-10-09 LAB — CBC
HCT: 33.2 % — ABNORMAL LOW (ref 36.0–46.0)
Hemoglobin: 11.3 g/dL — ABNORMAL LOW (ref 12.0–15.0)
MCH: 30.7 pg (ref 26.0–34.0)
MCHC: 34 g/dL (ref 30.0–36.0)
MCV: 90.2 fL (ref 80.0–100.0)
Platelets: 179 10*3/uL (ref 150–400)
RBC: 3.68 MIL/uL — ABNORMAL LOW (ref 3.87–5.11)
RDW: 12.9 % (ref 11.5–15.5)
WBC: 10.5 10*3/uL (ref 4.0–10.5)
nRBC: 0 % (ref 0.0–0.2)

## 2022-10-09 MED ORDER — ONDANSETRON HCL 4 MG PO TABS: 4.0000 mg | ORAL_TABLET | ORAL | Status: DC | PRN

## 2022-10-09 MED ORDER — ACETAMINOPHEN 325 MG PO TABS
650.0000 mg | ORAL_TABLET | ORAL | Status: DC | PRN
  Administered 2022-10-09: 650 mg via ORAL
  Filled 2022-10-09: qty 2

## 2022-10-09 MED ORDER — PRENATAL MULTIVITAMIN CH
1.0000 | ORAL_TABLET | Freq: Every day | ORAL | Status: DC
  Administered 2022-10-09 – 2022-10-10 (×2): 1 via ORAL
  Filled 2022-10-09 (×2): qty 1

## 2022-10-09 MED ORDER — ONDANSETRON HCL 4 MG/2ML IJ SOLN: 4.0000 mg | INTRAMUSCULAR | Status: DC | PRN

## 2022-10-09 MED ORDER — COCONUT OIL OIL: 1.0000 | TOPICAL_OIL | Status: DC | PRN

## 2022-10-09 MED ORDER — DIPHENHYDRAMINE HCL 25 MG PO CAPS: 25.0000 mg | ORAL_CAPSULE | Freq: Four times a day (QID) | ORAL | Status: DC | PRN

## 2022-10-09 MED ORDER — IBUPROFEN 600 MG PO TABS
600.0000 mg | ORAL_TABLET | Freq: Four times a day (QID) | ORAL | Status: DC
  Administered 2022-10-09 – 2022-10-10 (×6): 600 mg via ORAL
  Filled 2022-10-09 (×6): qty 1

## 2022-10-09 MED ORDER — SIMETHICONE 80 MG PO CHEW: 80.0000 mg | CHEWABLE_TABLET | ORAL | Status: DC | PRN

## 2022-10-09 MED ORDER — BENZOCAINE-MENTHOL 20-0.5 % EX AERO: 1.0000 | INHALATION_SPRAY | CUTANEOUS | Status: DC | PRN

## 2022-10-09 MED ORDER — DIBUCAINE (PERIANAL) 1 % EX OINT: 1.0000 | TOPICAL_OINTMENT | CUTANEOUS | Status: DC | PRN

## 2022-10-09 MED ORDER — WITCH HAZEL-GLYCERIN EX PADS: 1.0000 | MEDICATED_PAD | CUTANEOUS | Status: DC | PRN

## 2022-10-09 MED ORDER — SENNOSIDES-DOCUSATE SODIUM 8.6-50 MG PO TABS
2.0000 | ORAL_TABLET | Freq: Every day | ORAL | Status: DC
  Administered 2022-10-10: 2 via ORAL
  Filled 2022-10-09: qty 2

## 2022-10-09 MED ORDER — ZOLPIDEM TARTRATE 5 MG PO TABS: 5.0000 mg | ORAL_TABLET | Freq: Every evening | ORAL | Status: DC | PRN

## 2022-10-09 NOTE — Progress Notes (Signed)
Post Partum Day 0 Subjective: Doing well, no complaints.  Tolerating regular diet, pain with PO meds, voiding and ambulating without difficulty.  No CP SOB Fever,Chills, N/V or leg pain; denies nipple or breast pain no HA change of vision, RUQ/epigastric pain  Objective: BP 105/62 (BP Location: Left Arm)   Pulse 84   Temp 98.5 F (36.9 C) (Oral)   Resp 16   Ht 5' (1.524 m)   Wt 82.6 kg   LMP 01/01/2022 (Exact Date)   SpO2 98%   Breastfeeding Unknown   BMI 35.54 kg/m    Physical Exam:  General: NAD Breasts: soft/nontender CV: RRR Pulm: nl effort, CTABL Abdomen: soft, NT, BS x 4 Perineum: minimal edema, intact Lochia: moderate Uterine Fundus: fundus firm and 1 fb below umbilicus DVT Evaluation: no cords, ttp LEs   Recent Labs    10/08/22 0905 10/09/22 0511  HGB 12.8 11.3*  HCT 37.1 33.2*  WBC 5.5 10.5  PLT 196 179    Assessment/Plan: 31 y.o. G3P3003 postpartum day # 0  - Continue routine PP care - Lactation consult prn - Discussed contraceptive options including implant, IUDs hormonal and non-hormonal, injection, pills/ring/patch, condoms, and NFP.  - Acute blood loss anemia - hemodynamically stable and asymptomatic; start po ferrous sulfate BID with stool softeners  - Immunization status:  all Imms up to date    Disposition: Does not desire Dc home today.     Randa Ngo, CNM 10/09/2022  1:52 PM

## 2022-10-09 NOTE — Plan of Care (Signed)
Transferred to Room 343 PP. Alert and oriented with pleasant affect. Color good, skin W&D. Assessment and VS WNL. Oriented to Room, Safety and Security,Fall Prevention and POC via Interpreter [phone].

## 2022-10-09 NOTE — Lactation Note (Signed)
This note was copied from a baby's chart. Lactation Consultation Note  Patient Name: Bridget Cline [id number] Date: 10/09/2022 Reason for consult: Initial assessment;Term Age:31 hours  Maternal Data Has patient been taught Hand Expression?: Yes Does the patient have breastfeeding experience prior to this delivery?: Yes How long did the patient breastfeed?: varying times  P3, SVD 16 hours ago. Mom is an experienced breastfeeding mom; she breastfed for various lengths with other 2 children (now 36yrs and 71yrs), and also provided formula. She plans to feed this baby, Bridget Cline, the same way.  Feeding Mother's Current Feeding Choice: Breast Milk and Formula  LATCH Score   Baby sleeping soundly during this visit. Lactation Tools Discussed/Used Tools: Pump Breast pump type: Manual Pump Education: Setup, frequency, and cleaning;Milk Storage Reason for Pumping: mom's request Pumping frequency: as needed  Manual pump provided for EBM collection and use as supplement post BF if needed. All education given.  Interventions Interventions: Breast feeding basics reviewed;Hand express;Education;Hand pump;Pace feeding (newborn stomach size, feeding patterns/behaviors, on demand feedings, breast first, small volumes of supplement- use of EBM if possible first, output expectations, signs of good intake)  Encouraged mom to continue with current feeding efforts, reviewed hunger and satiety signs, and consistent with offering breast before bottle/supplement.  Discharge Pump: Manual (given in hospital)  Consult Status Consult Status: Follow-up  Encouraged to call out with questions/concerns and BF support as needed Plan for discharge tomorrow.  Bridget Cline 10/09/2022, 4:43 PM

## 2022-10-09 NOTE — Discharge Summary (Signed)
Obstetrical Discharge Summary  Patient Name: Bridget Cline DOB: 22-Nov-1990 MRN: [id number]  Date of Admission: 10/08/2022 Date of Delivery: 10/09/22 Delivered by: Dala Dock CNM  Date of Discharge: 10/10/22  Primary OB: [facility] [id number] last menstrual period was 01/01/2022 (exact date). EDC Estimated Date of Delivery: 10/08/22 Gestational Age at Delivery: [redacted]w[redacted]d   Antepartum complications:  1. GBS Pos UTI x 2 this preg 2. 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  Delivery Type: spontaneous vaginal delivery Anesthesia: IV narcotics Placenta: spontaneous To Pathology: No  Laceration: none Episiotomy: none Newborn Data: Live born female "Mateo" Birth Weight:  9lbs 5.2 oz APGAR: 9, 9  Newborn Delivery   Birth date/time: 10/09/2022 00:31:00 Delivery type: Vaginal, Spontaneous      Postpartum Procedures: none Edinburgh:     10/10/2022   10:09 AM  Edinburgh Postnatal Depression Scale Screening Tool  I have been able to laugh and see the funny side of things. 0  I have looked forward with enjoyment to things. 0  I have blamed myself unnecessarily when things  went wrong. 1  I have been anxious or worried for no good reason. 1  I have felt scared or panicky for no good reason. 1  Things have been getting on top of me. 1  I have been so unhappy that I have had difficulty sleeping. 0  I have felt sad or miserable. 0  I have been so unhappy that I have been crying. 0  The thought of harming myself has occurred to me. 0  Edinburgh Postnatal Depression Scale Total 4     Post partum course:  Patient had an uncomplicated postpartum course.  By time of discharge on PPD#1, her pain was controlled on oral pain medications; she had appropriate lochia and was ambulating, voiding without difficulty and tolerating regular diet.  She was deemed stable for discharge to home.      Discharge Physical Exam:  BP 100/61 (BP Location: Left Arm)   Pulse 70   Temp 98.2 F (36.8 C) (Oral)   Resp 20   Ht 5' (1.524 m)   Wt 82.6 kg   LMP 01/01/2022 (Exact Date)   SpO2 97%   Breastfeeding Unknown   BMI 35.54 kg/m   General: NAD CV: RRR Pulm: CTABL, nl effort ABD: s/nd/nt, fundus firm and below the umbilicus Lochia: moderate Perineum:minimal edema/intact DVT Evaluation: LE non-ttp, no evidence of DVT on exam.  Hemoglobin  Date Value Ref Range Status  10/09/2022 11.3 (L) 12.0 - 15.0 g/dL Final  02/77/4128 78.6 11.1 - 15.9 g/dL Final   HCT  Date Value Ref Range Status  10/09/2022 33.2 (L) 36.0 - 46.0 % Final   Hematocrit  Date Value Ref Range Status  04/22/2022 33.9 (L)  34.0 - 46.6 % Final    Risk assessment for postpartum VTE and prophylactic treatment: Very high risk factors: None High risk factors: None Moderate risk factors: BMI 30-40 kg/m2  Postpartum VTE prophylaxis with LMWH not indicated  Disposition: stable, discharge to home. Baby Feeding: breast and formula feeding Baby Disposition: home with mom  Rh Immune globulin indicated: No Rubella vaccine given: was not indicated Varivax vaccine given: was not indicated Flu vaccine given  in AP setting: Yes 08/12/22 Tdap vaccine given in AP setting: Yes 07/15/22  Contraception: condoms  Prenatal Labs:   Blood type/Rh O POS  Antibody screen neg  Rubella Immune  Varicella Immune  RPR NR  HBsAg Neg  HIV NR  GC neg  Chlamydia neg  Genetic screening negative  1 hour GTT  138; A1C 5.6  3 hour GTT  72-129-103-95  GBS POS urine     Plan:  Shaylynne Lunt was discharged to home in good condition. Follow-up appointment with  provider in 6 weeks.  Discharge Medications: Allergies as of 10/10/2022   No Known Allergies      Medication List     TAKE these medications    acetaminophen 325 MG tablet Commonly known as: Tylenol Take 2 tablets (650 mg total) by mouth every 4 (four) hours as needed (for pain scale < 4).   benzocaine-Menthol 20-0.5 % Aero Commonly known as: DERMOPLAST Apply 1 Application topically as needed for irritation (perineal discomfort).   coconut oil Oil Apply 1 Application topically as needed.   dibucaine 1 % Oint Commonly known as: NUPERCAINAL Place 1 Application rectally as needed for hemorrhoids.   ibuprofen 600 MG tablet Commonly known as: ADVIL Take 1 tablet (600 mg total) by mouth every 6 (six) hours.   Prenatal Vitamin 27-0.8 MG Tabs Take 1 tablet by mouth daily.   senna-docusate 8.6-50 MG tablet Commonly known as: Senokot-S Take 2 tablets by mouth daily. Start taking on: October 11, 2022   simethicone 80 MG chewable tablet Commonly known as: MYLICON Chew 1 tablet (80 mg total) by mouth as needed for flatulence.   witch hazel-glycerin pad Commonly known as: TUCKS Apply 1 Application topically as needed for hemorrhoids.         Follow-up Information     [facility] DEPT Follow up in 6 week(s).   Why: 6 pp visit Contact information: [address] [place] [address] [phone]                Signed: Chari Manning CNM

## 2022-10-09 NOTE — Plan of Care (Signed)
Care plan complete

## 2022-10-10 ENCOUNTER — Ambulatory Visit: Payer: Self-pay

## 2022-10-10 DIAGNOSIS — O9081 Anemia of the puerperium: Secondary | ICD-10-CM | POA: Diagnosis not present

## 2022-10-10 MED ORDER — IBUPROFEN 600 MG PO TABS: 600.0000 mg | ORAL_TABLET | Freq: Four times a day (QID) | ORAL | 0 refills | Status: DC

## 2022-10-10 MED ORDER — COCONUT OIL OIL: 1.0000 | TOPICAL_OIL | 0 refills | Status: DC | PRN

## 2022-10-10 MED ORDER — ACETAMINOPHEN 325 MG PO TABS: 650.0000 mg | ORAL_TABLET | ORAL | Status: DC | PRN

## 2022-10-10 MED ORDER — SENNOSIDES-DOCUSATE SODIUM 8.6-50 MG PO TABS: 2.0000 | ORAL_TABLET | Freq: Every day | ORAL | Status: DC

## 2022-10-10 MED ORDER — WITCH HAZEL-GLYCERIN EX PADS: 1.0000 | MEDICATED_PAD | CUTANEOUS | 12 refills | Status: DC | PRN

## 2022-10-10 MED ORDER — BENZOCAINE-MENTHOL 20-0.5 % EX AERO: 1.0000 | INHALATION_SPRAY | CUTANEOUS | Status: DC | PRN

## 2022-10-10 MED ORDER — SIMETHICONE 80 MG PO CHEW: 80.0000 mg | CHEWABLE_TABLET | ORAL | 0 refills | Status: DC | PRN

## 2022-10-10 MED ORDER — DIBUCAINE (PERIANAL) 1 % EX OINT: 1.0000 | TOPICAL_OINTMENT | CUTANEOUS | Status: DC | PRN

## 2022-10-10 NOTE — Progress Notes (Signed)
Pt discharged with infant.  Discharge instructions, prescriptions and follow up appointment given to and reviewed with pt. Pt verbalized understanding. Escorted out by staff. 

## 2022-10-10 NOTE — Lactation Note (Signed)
This note was copied from a baby's chart. Lactation Consultation Note  Patient Name: Bridget Cline [id number] Date: 10/10/2022   Age:31 hours  Maternal Data  This is mom's 3rd baby, SVD.  Today on follow-up mom with latch questions, how to use the harmony pump, and dietary questions while breastfeeding.  Feeding Breast and Formula Nipple Type: Slow - flow Tips and strategies provided to maximize latch technique.  Lactation Tools Discussed/Used  Manual, Harmony breastpump  Interventions  Breastfeeding basics, lanolin, Manual pump, education  Discharge  Engorgement management and breast care When to call the baby's Pediatrician in respect to feeding Outpatient recommendation  Consult Status  Complete  All dialogue with assistance of Spanish interpreter via IPAD. Update provided to care nurse.  Fuller Song 10/10/2022, 5:26 PM

## 2022-10-12 ENCOUNTER — Telehealth: Payer: Self-pay | Admitting: Family Medicine

## 2022-10-12 NOTE — Telephone Encounter (Signed)
Pt needs to know if the RSV vaccine was administered to her during the pregnancy. The pediatrician is asking in order to get the proper vaccines to the newborn. Thanks, please call her with an interpreter.

## 2022-10-12 NOTE — Telephone Encounter (Signed)
Returned patient's call and informed patient that she did not receive RSV vaccine during her pregnancy.   Patient verbalized understanding.   Earlyne Iba, RN

## 2022-10-14 ENCOUNTER — Ambulatory Visit: Payer: Medicaid Other

## 2022-11-19 NOTE — Progress Notes (Signed)
[facility] Department  Postpartum Exam  Bridget Cline is a 32 y.o. [phone] female who presents for a postpartum visit. She is 6 weeks postpartum following a normal spontaneous vaginal delivery.  I have fully reviewed the prenatal and intrapartum course. The delivery was at [redacted]w[redacted]d gestational weeks.  Anesthesia:  IV narcotics . Postpartum course has been good. Baby is doing well. Baby is feeding by breast. Bleeding  spotting . Bowel function is normal. Bladder function is normal. Patient is not sexually active. Contraception method is none. Postpartum depression screening: negative.   The pregnancy intention screening data noted above was reviewed. Potential methods of contraception were discussed. The patient elected to proceed with No data recorded.    Health Maintenance Due  Topic Date Due   COVID-19 Vaccine (3 - 2023-24 season) 07/01/2022    The following portions of the patient's history were reviewed and updated as appropriate: allergies, current medications, past family history, past medical history, past social history, past surgical history, and problem list.  Review of Systems A comprehensive review of systems was negative.  Objective:  There were no vitals taken for this visit.   General:  alert and cooperative   Breasts:  normal  Lungs: clear to auscultation bilaterally  Heart:  regular rate and rhythm, S1, S2 normal, no murmur, click, rub or gallop  Abdomen: soft, non-tender; bowel sounds normal; no masses,  no organomegaly   Wound none  GU exam:  normal       Assessment:    1. Encounter for postpartum care of lactating mother -32 year old female in clinic today for a postpartum exam. -ROS reviewed, no complaints. -Patient declines STD screening today. -Patient desires to only use condoms as a method.  -Hemoglobin today= 13.7  - Hemoglobin, venipuncture - Prenatal Vit-Fe Fumarate-FA (SM PRENATAL VITAMINS) 28-0.8 MG TABS; Take 1 tablet by  mouth daily.  Dispense: 30 tablet; Refill: 0   6 weeks postpartum exam.   Plan:   Essential components of care per ACOG recommendations:  1.  Mood and well being: Patient with negative depression screening today. Reviewed local resources for support.  - Patient tobacco use? No.   - hx of drug use? No.    2. Infant care and feeding:  -Patient currently breastmilk feeding? Yes. Reviewed importance of draining breast regularly to support lactation.  -Social determinants of health (SDOH) reviewed in EPIC. No concerns.    3. Sexuality, contraception and birth spacing - Patient does not want a pregnancy in the next year.  Desired family size is 4 children.  - Reviewed reproductive life planning. Reviewed options based on patient desire and reproductive life plan. Patient is interested in Female Condom. This was provided to the patient today.   Risks, benefits, and typical effectiveness rates were reviewed.  Questions were answered.  Written information was also given to the patient to review.    The patient will follow up in  1 years for surveillance.  The patient was told to call with any further questions, or with any concerns about this method of contraception.  Emphasized use of condoms 100% of the time for STI prevention.  ECP not offered due to not currently being sexually active.   - Discussed birth spacing of 18 months  4. Sleep and fatigue -Encouraged family/partner/community support of 4 hrs of uninterrupted sleep to help with mood and fatigue  5. Physical Recovery  - Discussed patients delivery and complications. She describes her labor as good. - Patient  had a Vaginal, no problems at delivery. Patient had  no  laceration. Perineal healing reviewed. Patient expressed understanding - Patient has urinary incontinence? No. - Patient is safe to resume physical and sexual activity  6.  Health Maintenance - HM due items addressed Yes - Last pap smear 04/22/22 Pap smear not done at  today's visit.  -Breast Cancer screening indicated? No.   7. Chronic Disease/Pregnancy Condition follow up: None  - PCP follow up  -Due to a language barrier the language line was used ([phone]) for the provider portion of the visit.     Gregary Cromer, [place] Department

## 2022-11-21 ENCOUNTER — Ambulatory Visit: Payer: Medicaid Other | Admitting: Nurse Practitioner

## 2022-11-21 ENCOUNTER — Encounter: Payer: Self-pay | Admitting: Nurse Practitioner

## 2022-11-21 LAB — HEMOGLOBIN, FINGERSTICK: Hemoglobin: 13.7 g/dL (ref 11.1–15.9)

## 2022-11-21 MED ORDER — SM PRENATAL VITAMINS 28-0.8 MG PO TABS: 1.0000 | ORAL_TABLET | Freq: Every day | ORAL | 0 refills | Status: DC

## 2022-11-21 MED ORDER — SM PRENATAL VITAMINS 28-0.8 MG PO TABS: 1.0000 | ORAL_TABLET | Freq: Every day | ORAL | 2 refills | Status: AC

## 2022-11-21 NOTE — Progress Notes (Signed)
Reviewed in Clinic. Hgb = 13.7.  Harland Dingwall, RN

## 2022-11-22 ENCOUNTER — Encounter: Payer: Self-pay | Admitting: Nurse Practitioner

## 2022-12-06 ENCOUNTER — Telehealth: Payer: Self-pay | Admitting: Licensed Clinical Social Worker

## 2022-12-06 NOTE — Telephone Encounter (Signed)
LCSW received message from clerical that patient was interested in services and was provided with LCSW's number to reach out. LCSW attempted to call patient with language line [phone]. Lcsw scheduled an appointment.

## 2022-12-15 ENCOUNTER — Ambulatory Visit: Payer: Medicaid Other | Admitting: Licensed Clinical Social Worker
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
## 2023-11-20 DIAGNOSIS — H1032 Unspecified acute conjunctivitis, left eye: Secondary | ICD-10-CM | POA: Diagnosis not present

## 2023-12-27 DIAGNOSIS — Z20822 Contact with and (suspected) exposure to covid-19: Secondary | ICD-10-CM | POA: Diagnosis not present

## 2023-12-27 DIAGNOSIS — R070 Pain in throat: Secondary | ICD-10-CM | POA: Diagnosis not present

## 2023-12-27 DIAGNOSIS — J101 Influenza due to other identified influenza virus with other respiratory manifestations: Secondary | ICD-10-CM | POA: Diagnosis not present
